# Patient Record
Sex: Male | Born: 1945
Health system: Southern US, Community
[De-identification: ages and names within clinical notes are randomized; demographics above are authoritative.]

## PROBLEM LIST (undated history)

## (undated) DIAGNOSIS — F319 Bipolar disorder, unspecified: Secondary | ICD-10-CM

## (undated) DIAGNOSIS — I10 Essential (primary) hypertension: Secondary | ICD-10-CM

---

## 2007-03-18 ENCOUNTER — Emergency Department (HOSPITAL_COMMUNITY): Admission: EM | Admit: 2007-03-18 | Discharge: 2007-03-18 | Payer: Self-pay | Admitting: Emergency Medicine

## 2011-06-07 ENCOUNTER — Other Ambulatory Visit: Payer: Self-pay | Admitting: Otolaryngology

## 2011-06-12 ENCOUNTER — Ambulatory Visit
Admission: RE | Admit: 2011-06-12 | Discharge: 2011-06-12 | Disposition: A | Discharge status: Home / Self Care | Payer: 59 | Source: Ambulatory Visit | Attending: Otolaryngology | Admitting: Otolaryngology

## 2011-06-12 MED ORDER — GADOBENATE DIMEGLUMINE 529 MG/ML IV SOLN
20.0000 mL | Freq: Once | INTRAVENOUS | Status: AC | PRN
  Administered 2011-06-12: 20 mL via INTRAVENOUS

## 2013-11-11 ENCOUNTER — Ambulatory Visit: Payer: 59 | Admitting: Internal Medicine

## 2015-02-17 DIAGNOSIS — F200 Paranoid schizophrenia: Secondary | ICD-10-CM | POA: Diagnosis not present

## 2015-02-18 ENCOUNTER — Ambulatory Visit (INDEPENDENT_AMBULATORY_CARE_PROVIDER_SITE_OTHER): Payer: Commercial Managed Care - HMO | Admitting: Emergency Medicine

## 2015-02-18 VITALS — BP 146/94 | HR 66 | Temp 97.7°F | Resp 17 | Ht 71.0 in | Wt 210.0 lb

## 2015-02-18 DIAGNOSIS — F319 Bipolar disorder, unspecified: Secondary | ICD-10-CM | POA: Insufficient documentation

## 2015-02-18 DIAGNOSIS — I10 Essential (primary) hypertension: Secondary | ICD-10-CM | POA: Diagnosis not present

## 2015-02-18 MED ORDER — HYDROCHLOROTHIAZIDE 25 MG PO TABS: 25.0000 mg | ORAL_TABLET | Freq: Every day | ORAL | Status: DC

## 2015-02-18 NOTE — Patient Instructions (Signed)
Come back in a month FASTING for lab tests and blood pressure check  Hypertension Hypertension, commonly called high blood pressure, is when the force of blood pumping through your arteries is too strong. Your arteries are the blood vessels that carry blood from your heart throughout your body. A blood pressure reading consists of a higher number over a lower number, such as 110/72. The higher number (systolic) is the pressure inside your arteries when your heart pumps. The lower number (diastolic) is the pressure inside your arteries when your heart relaxes. Ideally you want your blood pressure below 120/80. Hypertension forces your heart to work harder to pump blood. Your arteries may become narrow or stiff. Having hypertension puts you at risk for heart disease, stroke, and other problems.  RISK FACTORS Some risk factors for high blood pressure are controllable. Others are not.  Risk factors you cannot control include:   Race. You may be at higher risk if you are African American.  Age. Risk increases with age.  Gender. Men are at higher risk than women before age 68 years. After age 68, women are at higher risk than men. Risk factors you can control include:  Not getting enough exercise or physical activity.  Being overweight.  Getting too much fat, sugar, calories, or salt in your diet.  Drinking too much alcohol. SIGNS AND SYMPTOMS Hypertension does not usually cause signs or symptoms. Extremely high blood pressure (hypertensive crisis) may cause headache, anxiety, shortness of breath, and nosebleed. DIAGNOSIS  To check if you have hypertension, your health care provider will measure your blood pressure while you are seated, with your arm held at the level of your heart. It should be measured at least twice using the same arm. Certain conditions can cause a difference in blood pressure between your right and left arms. A blood pressure reading that is higher than normal on one  occasion does not mean that you need treatment. If one blood pressure reading is high, ask your health care provider about having it checked again. TREATMENT  Treating high blood pressure includes making lifestyle changes and possibly taking medicine. Living a healthy lifestyle can help lower high blood pressure. You may need to change some of your habits. Lifestyle changes may include:  Following the DASH diet. This diet is high in fruits, vegetables, and whole grains. It is low in salt, red meat, and added sugars.  Getting at least 2 hours of brisk physical activity every week.  Losing weight if necessary.  Not smoking.  Limiting alcoholic beverages.  Learning ways to reduce stress. If lifestyle changes are not enough to get your blood pressure under control, your health care provider may prescribe medicine. You may need to take more than one. Work closely with your health care provider to understand the risks and benefits. HOME CARE INSTRUCTIONS  Have your blood pressure rechecked as directed by your health care provider.   Take medicines only as directed by your health care provider. Follow the directions carefully. Blood pressure medicines must be taken as prescribed. The medicine does not work as well when you skip doses. Skipping doses also puts you at risk for problems.   Do not smoke.   Monitor your blood pressure at home as directed by your health care provider. SEEK MEDICAL CARE IF:   You think you are having a reaction to medicines taken.  You have recurrent headaches or feel dizzy.  You have swelling in your ankles.  You have trouble with your  vision. SEEK IMMEDIATE MEDICAL CARE IF:  You develop a severe headache or confusion.  You have unusual weakness, numbness, or feel faint.  You have severe chest or abdominal pain.  You vomit repeatedly.  You have trouble breathing. MAKE SURE YOU:   Understand these instructions.  Will watch your  condition.  Will get help right away if you are not doing well or get worse. Document Released: 11/06/2005 Document Revised: 03/23/2014 Document Reviewed: 08/29/2013 [facility] Patient Information 2015 [place], [place]. This information is not intended to replace advice given to you by your health care provider. Make sure you discuss any questions you have with your health care provider.

## 2015-02-18 NOTE — Progress Notes (Signed)
[facility] [address], [place] [id number] [phone]  Date:  02/18/2015   Name:  Todd MeliaRogers L Neal   DOB:  12/02/1945   MRN:  [id number]  PCP:  No primary care provider on file.    Chief Complaint: Hypertension   History of Present Illness:  Todd Neal is a 69 y.o. very pleasant male patient who presents with the following:  At psychiatrist's office yesterday and told he had an elevated blood pressure Thinks it was "190 something". No end organ injury No chest pain, shortness of breath.  No edema. No improvement with over the counter medications or other home remedies.  No history of CAD, HBP, HLD, DM Denies other complaint or health concern today.   Patient Active Problem List   Diagnosis Date Noted  . Bipolar disorder 02/18/2015    History reviewed. No pertinent past medical history.  History reviewed. No pertinent past surgical history.  History  Substance Use Topics  . Smoking status: Current Every Day Smoker -- 0.50 packs/day for 55 years    Types: Cigarettes  . Smokeless tobacco: Not on file  . Alcohol Use: No    History reviewed. No pertinent family history.  No Known Allergies  Medication list has been reviewed and updated.  No current outpatient prescriptions on file prior to visit.   No current facility-administered medications on file prior to visit.    Review of Systems:  As per HPI, otherwise negative.    Physical Examination: Filed Vitals:   02/18/15 1038  BP: 146/94  Pulse: 66  Temp: 97.7 F (36.5 C)  Resp: 17   Filed Vitals:   02/18/15 1038  Height: 5\' 11"  (1.803 m)  Weight: 210 lb (95.255 kg)   Body mass index is 29.3 kg/(m^2). Ideal Body Weight: Weight in (lb) to have BMI = 25: 178.9  GEN: WDWN, NAD, Non-toxic, A & O x 3 HEENT: Atraumatic, Normocephalic. Neck supple. No masses, No LAD. Ears and Nose: No external deformity. CV: RRR, No M/G/R. No JVD. No thrill. No extra heart sounds. PULM:  CTA B, no wheezes, crackles, rhonchi. No retractions. No resp. distress. No accessory muscle use. ABD: S, NT, ND, +BS. No rebound. No HSM. EXTR: No c/c/e NEURO Normal gait.  PSYCH: Normally interactive. Conversant. Not depressed or anxious appearing.  Calm demeanor.    Assessment and Plan: Hypertension HCTZ Follow up one month fasting for labs  Signed,  Phillips OdorJeffery Sadonna Kotara, MD

## 2015-02-22 DIAGNOSIS — Z79899 Other long term (current) drug therapy: Secondary | ICD-10-CM | POA: Diagnosis not present

## 2015-02-22 DIAGNOSIS — F209 Schizophrenia, unspecified: Secondary | ICD-10-CM | POA: Diagnosis not present

## 2015-03-15 DIAGNOSIS — F200 Paranoid schizophrenia: Secondary | ICD-10-CM | POA: Diagnosis not present

## 2015-03-29 DIAGNOSIS — Z79899 Other long term (current) drug therapy: Secondary | ICD-10-CM | POA: Diagnosis not present

## 2015-03-29 DIAGNOSIS — F200 Paranoid schizophrenia: Secondary | ICD-10-CM | POA: Diagnosis not present

## 2015-04-27 DIAGNOSIS — F200 Paranoid schizophrenia: Secondary | ICD-10-CM | POA: Diagnosis not present

## 2015-04-28 DIAGNOSIS — Z79899 Other long term (current) drug therapy: Secondary | ICD-10-CM | POA: Diagnosis not present

## 2015-04-28 DIAGNOSIS — F200 Paranoid schizophrenia: Secondary | ICD-10-CM | POA: Diagnosis not present

## 2015-08-10 DIAGNOSIS — F200 Paranoid schizophrenia: Secondary | ICD-10-CM | POA: Diagnosis not present

## 2017-08-01 ENCOUNTER — Encounter: Payer: Self-pay | Admitting: Emergency Medicine

## 2017-08-01 ENCOUNTER — Ambulatory Visit (INDEPENDENT_AMBULATORY_CARE_PROVIDER_SITE_OTHER): Payer: Medicare HMO | Admitting: Emergency Medicine

## 2017-08-01 VITALS — BP 170/90 | HR 53 | Temp 97.4°F | Resp 16 | Ht 70.5 in | Wt 218.6 lb

## 2017-08-01 DIAGNOSIS — Z1211 Encounter for screening for malignant neoplasm of colon: Secondary | ICD-10-CM | POA: Diagnosis not present

## 2017-08-01 DIAGNOSIS — I10 Essential (primary) hypertension: Secondary | ICD-10-CM | POA: Diagnosis not present

## 2017-08-01 DIAGNOSIS — Z6830 Body mass index (BMI) 30.0-30.9, adult: Secondary | ICD-10-CM

## 2017-08-01 DIAGNOSIS — E669 Obesity, unspecified: Secondary | ICD-10-CM

## 2017-08-01 MED ORDER — HYDROCHLOROTHIAZIDE 25 MG PO TABS: 25.0000 mg | ORAL_TABLET | Freq: Every day | ORAL | 3 refills | Status: DC

## 2017-08-01 NOTE — Patient Instructions (Addendum)
   IF you received an x-ray today, you will receive an invoice from [facility]. Please contact [facility] at [phone] with questions or concerns regarding your invoice.   IF you received labwork today, you will receive an invoice from [facility]. Please contact [facility] at [phone] with questions or concerns regarding your invoice.   Our billing staff will not be able to assist you with questions regarding bills from these companies.  You will be contacted with the lab results as soon as they are available. The fastest way to get your results is to activate your My Chart account. Instructions are located on the last page of this paperwork. If you have not heard from us regarding the results in 2 weeks, please contact this office.     How to Take Your Blood Pressure You can take your blood pressure at home with a machine. You may need to check your blood pressure at home:  To check if you have high blood pressure (hypertension).  To check your blood pressure over time.  To make sure your blood pressure medicine is working.  Supplies needed: You will need a blood pressure machine, or monitor. You can buy one at a drugstore or online. When choosing one:  Choose one with an arm cuff.  Choose one that wraps around your upper arm. Only one finger should fit between your arm and the cuff.  Do not choose one that measures your blood pressure from your wrist or finger.  Your doctor can suggest a monitor. How to prepare Avoid these things for 30 minutes before checking your blood pressure:  Drinking caffeine.  Drinking alcohol.  Eating.  Smoking.  Exercising.  Five minutes before checking your blood pressure:  Pee.  Sit in a dining chair. Avoid sitting in a soft couch or armchair.  Be quiet. Do not talk.  How to take your blood pressure Follow the instructions that came with your machine. If you have a digital blood pressure monitor, these may  be the instructions: 1. Sit up straight. 2. Place your feet on the floor. Do not cross your ankles or legs. 3. Rest your left arm at the level of your heart. You may rest it on a table, desk, or chair. 4. Pull up your shirt sleeve. 5. Wrap the blood pressure cuff around the upper part of your left arm. The cuff should be 1 inch (2.5 cm) above your elbow. It is best to wrap the cuff around bare skin. 6. Fit the cuff snugly around your arm. You should be able to place only one finger between the cuff and your arm. 7. Put the cord inside the groove of your elbow. 8. Press the power button. 9. Sit quietly while the cuff fills with air and loses air. 10. Write down the numbers on the screen. 11. Wait 2-3 minutes and then repeat steps 1-10.  What do the numbers mean? Two numbers make up your blood pressure. The first number is called systolic pressure. The second is called diastolic pressure. An example of a blood pressure reading is "120 over 80" (or 120/80). If you are an adult and do not have a medical condition, use this guide to find out if your blood pressure is normal: Normal  First number: below 120.  Second number: below 80. Elevated  First number: 120-129.  Second number: below 80. Hypertension stage 1  First number: 130-139.  Second number: 80-89. Hypertension stage 2  First number: 140 or above.    Second number: 90 or above. Your blood pressure is above normal even if only the top or bottom number is above normal. Follow these instructions at home:  Check your blood pressure as often as your doctor tells you to.  Take your monitor to your next doctor's appointment. Your doctor will: ? Make sure you are using it correctly. ? Make sure it is working right.  Make sure you understand what your blood pressure numbers should be.  Tell your doctor if your medicines are causing side effects. Contact a doctor if:  Your blood pressure keeps being high. Get help right  away if:  Your first blood pressure number is higher than 180.  Your second blood pressure number is higher than 120. This information is not intended to replace advice given to you by your health care provider. Make sure you discuss any questions you have with your health care provider. Document Released: 10/19/2008 Document Revised: 10/04/2016 Document Reviewed: 04/14/2016 Elsevier Interactive Patient Education  2018 [employer]  Hypertension Hypertension is another name for high blood pressure. High blood pressure forces your heart to work harder to pump blood. This can cause problems over time. There are two numbers in a blood pressure reading. There is a top number (systolic) over a bottom number (diastolic). It is best to have a blood pressure below 120/80. Healthy choices can help lower your blood pressure. You may need medicine to help lower your blood pressure if:  Your blood pressure cannot be lowered with healthy choices.  Your blood pressure is higher than 130/80.  Follow these instructions at home: Eating and drinking  If directed, follow the DASH eating plan. This diet includes: ? Filling half of your plate at each meal with fruits and vegetables. ? Filling one quarter of your plate at each meal with whole grains. Whole grains include whole wheat pasta, brown rice, and whole grain bread. ? Eating or drinking low-fat dairy products, such as skim milk or low-fat yogurt. ? Filling one quarter of your plate at each meal with low-fat (lean) proteins. Low-fat proteins include fish, skinless chicken, eggs, beans, and tofu. ? Avoiding fatty meat, cured and processed meat, or chicken with skin. ? Avoiding premade or processed food.  Eat less than 1,500 mg of salt (sodium) a day.  Limit alcohol use to no more than 1 drink a day for nonpregnant women and 2 drinks a day for men. One drink equals 12 oz of beer, 5 oz of wine, or 1 oz of hard liquor. Lifestyle  Work with your  doctor to stay at a healthy weight or to lose weight. Ask your doctor what the best weight is for you.  Get at least 30 minutes of exercise that causes your heart to beat faster (aerobic exercise) most days of the week. This may include walking, swimming, or biking.  Get at least 30 minutes of exercise that strengthens your muscles (resistance exercise) at least 3 days a week. This may include lifting weights or pilates.  Do not use any products that contain nicotine or tobacco. This includes cigarettes and e-cigarettes. If you need help quitting, ask your doctor.  Check your blood pressure at home as told by your doctor.  Keep all follow-up visits as told by your doctor. This is important. Medicines  Take over-the-counter and prescription medicines only as told by your doctor. Follow directions carefully.  Do not skip doses of blood pressure medicine. The medicine does not work as well if you skip doses.   Skipping doses also puts you at risk for problems.  Ask your doctor about side effects or reactions to medicines that you should watch for. Contact a doctor if:  You think you are having a reaction to the medicine you are taking.  You have headaches that keep coming back (recurring).  You feel dizzy.  You have swelling in your ankles.  You have trouble with your vision. Get help right away if:  You get a very bad headache.  You start to feel confused.  You feel weak or numb.  You feel faint.  You get very bad pain in your: ? Chest. ? Belly (abdomen).  You throw up (vomit) more than once.  You have trouble breathing. Summary  Hypertension is another name for high blood pressure.  Making healthy choices can help lower blood pressure. If your blood pressure cannot be controlled with healthy choices, you may need to take medicine. This information is not intended to replace advice given to you by your health care provider. Make sure you discuss any questions you have  with your health care provider. Document Released: 04/24/2008 Document Revised: 10/04/2016 Document Reviewed: 10/04/2016 Elsevier Interactive Patient Education  2018 [employer].  

## 2017-08-01 NOTE — Progress Notes (Signed)
Todd Neal 71 y.o.   Chief Complaint  Patient presents with  . Medication Refill    per patient and restart     HISTORY OF PRESENT ILLNESS: This is a 71 y.o. male with h/o HTN but off medication; no complaints; needs refill of HCTZ.  Hypertension  This is a chronic problem. The current episode started more than 1 year ago. The problem has been waxing and waning since onset. The problem is uncontrolled. Pertinent negatives include no blurred vision, chest pain, headaches, malaise/fatigue, neck pain, palpitations, peripheral edema, PND, shortness of breath or sweats. There are no associated agents to hypertension. Risk factors for coronary artery disease include male gender and sedentary lifestyle. Past treatments include diuretics. There is no history of angina, CAD/MI, CVA or heart failure.     Prior to Admission medications   Medication Sig Start Date End Date Taking? Authorizing Provider  hydrochlorothiazide (HYDRODIURIL) 25 MG tablet Take 1 tablet (25 mg total) by mouth daily. Patient not taking: Reported on 08/01/2017 02/18/15   Carmelina Dane, MD  lithium carbonate 300 MG capsule Take 300 mg by mouth 3 (three) times daily with meals.    [provider]    No Known Allergies  Patient Active Problem List   Diagnosis Date Noted  . Bipolar disorder (HCC) 02/18/2015    No past medical history on file.  No past surgical history on file.  Social History   Social History  . Marital status: Married    Spouse name: N/A  . Number of children: N/A  . Years of education: N/A   Occupational History  . Not on file.   Social History Main Topics  . Smoking status: Current Every Day Smoker    Packs/day: 0.50    Years: 55.00    Types: Cigarettes  . Smokeless tobacco: Never Used  . Alcohol use No  . Drug use: No  . Sexual activity: No   Other Topics Concern  . Not on file   Social History Narrative  . No narrative on file    No family history on  file.   Review of Systems  Constitutional: Negative for chills, fever and malaise/fatigue.  HENT: Negative.  Negative for sore throat.   Eyes: Negative.  Negative for blurred vision and double vision.  Respiratory: Negative.  Negative for cough and shortness of breath.   Cardiovascular: Negative.  Negative for chest pain, palpitations, claudication, leg swelling and PND.  Gastrointestinal: Negative.  Negative for abdominal pain, diarrhea, nausea and vomiting.  Genitourinary: Negative.   Musculoskeletal: Negative.  Negative for back pain, myalgias and neck pain.  Skin: Negative.  Negative for rash.  Neurological: Negative for dizziness, sensory change, speech change, focal weakness and headaches.  All other systems reviewed and are negative.  Vitals:   08/01/17 1346 08/01/17 1352  BP: (!) 176/75 (!) 170/90  Pulse: (!) 53   Resp: 16   Temp: (!) 97.4 F (36.3 C)   SpO2: 98%      Physical Exam  Constitutional: He is oriented to person, place, and time. He appears well-developed and well-nourished.  HENT:  Head: Normocephalic and atraumatic.  Right Ear: External ear normal.  Left Ear: External ear normal.  Nose: Nose normal.  Mouth/Throat: Oropharynx is clear and moist.  Eyes: Pupils are equal, round, and reactive to light. Conjunctivae are normal.  Neck: Normal range of motion. Neck supple. No JVD present. Carotid bruit is not present. No thyromegaly present.  Cardiovascular: Normal rate, regular  rhythm, normal heart sounds and intact distal pulses.   Pulmonary/Chest: Effort normal and breath sounds normal.  Abdominal: He exhibits no distension. There is no tenderness.  Musculoskeletal: Normal range of motion.  Lymphadenopathy:    He has no cervical adenopathy.  Neurological: He is alert and oriented to person, place, and time. No sensory deficit. He exhibits normal muscle tone.  Skin: Skin is warm and dry. Capillary refill takes less than 2 seconds. No rash noted.   Psychiatric: He has a normal mood and affect. His behavior is normal.  Vitals reviewed.    ASSESSMENT & PLAN: Todd Neal was seen today for medication refill.  Diagnoses and all orders for this visit:  Essential hypertension -     CBC with Differential/Platelet -     Comprehensive metabolic panel -     Hemoglobin A1c -     Hepatitis C antibody -     hydrochlorothiazide (HYDRODIURIL) 25 MG tablet; Take 1 tablet (25 mg total) by mouth daily.  Colon cancer screening -     Ambulatory referral to Gastroenterology    Patient Instructions       IF you received an x-ray today, you will receive an invoice from [facility]. Please contact [facility] at [phone] with questions or concerns regarding your invoice.   IF you received labwork today, you will receive an invoice from [facility]. Please contact [facility] at [phone] with questions or concerns regarding your invoice.   Our billing staff will not be able to assist you with questions regarding bills from these companies.  You will be contacted with the lab results as soon as they are available. The fastest way to get your results is to activate your My Chart account. Instructions are located on the last page of this paperwork. If you have not heard from us regarding the results in 2 weeks, please contact this office.     How to Take Your Blood Pressure You can take your blood pressure at home with a machine. You may need to check your blood pressure at home:  To check if you have high blood pressure (hypertension).  To check your blood pressure over time.  To make sure your blood pressure medicine is working.  Supplies needed: You will need a blood pressure machine, or monitor. You can buy one at a drugstore or online. When choosing one:  Choose one with an arm cuff.  Choose one that wraps around your upper arm. Only one finger should fit between your arm and the cuff.  Do not choose one that  measures your blood pressure from your wrist or finger.  Your doctor can suggest a monitor. How to prepare Avoid these things for 30 minutes before checking your blood pressure:  Drinking caffeine.  Drinking alcohol.  Eating.  Smoking.  Exercising.  Five minutes before checking your blood pressure:  Pee.  Sit in a dining chair. Avoid sitting in a soft couch or armchair.  Be quiet. Do not talk.  How to take your blood pressure Follow the instructions that came with your machine. If you have a digital blood pressure monitor, these may be the instructions: 1. Sit up straight. 2. Place your feet on the floor. Do not cross your ankles or legs. 3. Rest your left arm at the level of your heart. You may rest it on a table, desk, or chair. 4. Pull up your shirt sleeve. 5. Wrap the blood pressure cuff around the upper part of your left arm. The  cuff should be 1 inch (2.5 cm) above your elbow. It is best to wrap the cuff around bare skin. 6. Fit the cuff snugly around your arm. You should be able to place only one finger between the cuff and your arm. 7. Put the cord inside the groove of your elbow. 8. Press the power button. 9. Sit quietly while the cuff fills with air and loses air. 10. Write down the numbers on the screen. 11. Wait 2-3 minutes and then repeat steps 1-10.  What do the numbers mean? Two numbers make up your blood pressure. The first number is called systolic pressure. The second is called diastolic pressure. An example of a blood pressure reading is "120 over 80" (or 120/80). If you are an adult and do not have a medical condition, use this guide to find out if your blood pressure is normal: Normal  First number: below 120.  Second number: below 80. Elevated  First number: 120-129.  Second number: below 80. Hypertension stage 1  First number: 130-139.  Second number: 80-89. Hypertension stage 2  First number: 140 or above.  Second number: 90 or  above. Your blood pressure is above normal even if only the top or bottom number is above normal. Follow these instructions at home:  Check your blood pressure as often as your doctor tells you to.  Take your monitor to your next doctor's appointment. Your doctor will: ? Make sure you are using it correctly. ? Make sure it is working right.  Make sure you understand what your blood pressure numbers should be.  Tell your doctor if your medicines are causing side effects. Contact a doctor if:  Your blood pressure keeps being high. Get help right away if:  Your first blood pressure number is higher than 180.  Your second blood pressure number is higher than 120. This information is not intended to replace advice given to you by your health care provider. Make sure you discuss any questions you have with your health care provider. Document Released: 10/19/2008 Document Revised: 10/04/2016 Document Reviewed: 04/14/2016 Elsevier Interactive Patient Education  2018 [employer].  Hypertension Hypertension is another name for high blood pressure. High blood pressure forces your heart to work harder to pump blood. This can cause problems over time. There are two numbers in a blood pressure reading. There is a top number (systolic) over a bottom number (diastolic). It is best to have a blood pressure below 120/80. Healthy choices can help lower your blood pressure. You may need medicine to help lower your blood pressure if:  Your blood pressure cannot be lowered with healthy choices.  Your blood pressure is higher than 130/80.  Follow these instructions at home: Eating and drinking  If directed, follow the DASH eating plan. This diet includes: ? Filling half of your plate at each meal with fruits and vegetables. ? Filling one quarter of your plate at each meal with whole grains. Whole grains include whole wheat pasta, brown rice, and whole grain bread. ? Eating or drinking low-fat dairy  products, such as skim milk or low-fat yogurt. ? Filling one quarter of your plate at each meal with low-fat (lean) proteins. Low-fat proteins include fish, skinless chicken, eggs, beans, and tofu. ? Avoiding fatty meat, cured and processed meat, or chicken with skin. ? Avoiding premade or processed food.  Eat less than 1,500 mg of salt (sodium) a day.  Limit alcohol use to no more than 1 drink a day for nonpregnant  women and 2 drinks a day for men. One drink equals 12 oz of beer, 5 oz of wine, or 1 oz of hard liquor. Lifestyle  Work with your doctor to stay at a healthy weight or to lose weight. Ask your doctor what the best weight is for you.  Get at least 30 minutes of exercise that causes your heart to beat faster (aerobic exercise) most days of the week. This may include walking, swimming, or biking.  Get at least 30 minutes of exercise that strengthens your muscles (resistance exercise) at least 3 days a week. This may include lifting weights or pilates.  Do not use any products that contain nicotine or tobacco. This includes cigarettes and e-cigarettes. If you need help quitting, ask your doctor.  Check your blood pressure at home as told by your doctor.  Keep all follow-up visits as told by your doctor. This is important. Medicines  Take over-the-counter and prescription medicines only as told by your doctor. Follow directions carefully.  Do not skip doses of blood pressure medicine. The medicine does not work as well if you skip doses. Skipping doses also puts you at risk for problems.  Ask your doctor about side effects or reactions to medicines that you should watch for. Contact a doctor if:  You think you are having a reaction to the medicine you are taking.  You have headaches that keep coming back (recurring).  You feel dizzy.  You have swelling in your ankles.  You have trouble with your vision. Get help right away if:  You get a very bad headache.  You  start to feel confused.  You feel weak or numb.  You feel faint.  You get very bad pain in your: ? Chest. ? Belly (abdomen).  You throw up (vomit) more than once.  You have trouble breathing. Summary  Hypertension is another name for high blood pressure.  Making healthy choices can help lower blood pressure. If your blood pressure cannot be controlled with healthy choices, you may need to take medicine. This information is not intended to replace advice given to you by your health care provider. Make sure you discuss any questions you have with your health care provider. Document Released: 04/24/2008 Document Revised: 10/04/2016 Document Reviewed: 10/04/2016 Elsevier Interactive Patient Education  2018 Elsevier Inc.      Edwina Barth, MD [facility]

## 2017-08-02 LAB — COMPREHENSIVE METABOLIC PANEL
A/G RATIO: 1.8 (ref 1.2–2.2)
ALT: 16 IU/L (ref 0–44)
AST: 18 IU/L (ref 0–40)
Albumin: 4.5 g/dL (ref 3.6–4.8)
Alkaline Phosphatase: 82 IU/L (ref 39–117)
BILIRUBIN TOTAL: 0.2 mg/dL (ref 0.0–1.2)
BUN/Creatinine Ratio: 14 (ref 10–24)
BUN: 13 mg/dL (ref 8–27)
CALCIUM: 9.8 mg/dL (ref 8.6–10.2)
CHLORIDE: 105 mmol/L (ref 96–106)
CO2: 23 mmol/L (ref 20–29)
Creatinine, Ser: 0.96 mg/dL (ref 0.76–1.27)
GFR calc Af Amer: 93 mL/min/{1.73_m2} (ref >59)
GFR calc non Af Amer: 80 mL/min/{1.73_m2} (ref >59)
GLUCOSE: 102 mg/dL — AB (ref 65–99)
Globulin, Total: 2.5 g/dL (ref 1.5–4.5)
POTASSIUM: 4.3 mmol/L (ref 3.5–5.2)
Sodium: 143 mmol/L (ref 134–144)
Total Protein: 7 g/dL (ref 6.0–8.5)

## 2017-08-02 LAB — CBC WITH DIFFERENTIAL/PLATELET
BASOS: 0 %
Basophils Absolute: 0 10*3/uL (ref 0.0–0.2)
EOS (ABSOLUTE): 0.3 10*3/uL (ref 0.0–0.4)
Eos: 3 %
Hematocrit: 43.8 % (ref 37.5–51.0)
Hemoglobin: 13.9 g/dL (ref 13.0–17.7)
IMMATURE GRANULOCYTES: 0 %
Immature Grans (Abs): 0 10*3/uL (ref 0.0–0.1)
LYMPHS: 46 %
Lymphocytes Absolute: 4.2 10*3/uL — ABNORMAL HIGH (ref 0.7–3.1)
MCH: 30 pg (ref 26.6–33.0)
MCHC: 31.7 g/dL (ref 31.5–35.7)
MCV: 94 fL (ref 79–97)
Monocytes Absolute: 0.6 10*3/uL (ref 0.1–0.9)
Monocytes: 7 %
NEUTROS PCT: 44 %
Neutrophils Absolute: 4 10*3/uL (ref 1.4–7.0)
PLATELETS: 309 10*3/uL (ref 150–379)
RBC: 4.64 x10E6/uL (ref 4.14–5.80)
RDW: 14.3 % (ref 12.3–15.4)
WBC: 9.1 10*3/uL (ref 3.4–10.8)

## 2017-08-02 LAB — HEMOGLOBIN A1C
ESTIMATED AVERAGE GLUCOSE: 117 mg/dL
Hgb A1c MFr Bld: 5.7 % — ABNORMAL HIGH (ref 4.8–5.6)

## 2017-08-02 LAB — HEPATITIS C ANTIBODY: Hep C Virus Ab: <0.1 s/co ratio (ref 0.0–0.9)

## 2017-08-06 ENCOUNTER — Encounter: Payer: Self-pay | Admitting: *Deleted

## 2017-09-10 ENCOUNTER — Encounter: Payer: Self-pay | Admitting: Emergency Medicine

## 2017-11-02 ENCOUNTER — Ambulatory Visit: Payer: Medicare HMO | Admitting: Emergency Medicine

## 2018-02-05 DIAGNOSIS — M4696 Unspecified inflammatory spondylopathy, lumbar region: Secondary | ICD-10-CM | POA: Diagnosis not present

## 2018-02-05 DIAGNOSIS — M545 Low back pain: Secondary | ICD-10-CM | POA: Diagnosis not present

## 2018-03-04 DIAGNOSIS — I10 Essential (primary) hypertension: Secondary | ICD-10-CM | POA: Diagnosis not present

## 2018-03-04 DIAGNOSIS — E669 Obesity, unspecified: Secondary | ICD-10-CM | POA: Diagnosis not present

## 2018-03-04 DIAGNOSIS — Z131 Encounter for screening for diabetes mellitus: Secondary | ICD-10-CM | POA: Diagnosis not present

## 2018-03-04 DIAGNOSIS — R69 Illness, unspecified: Secondary | ICD-10-CM | POA: Diagnosis not present

## 2018-03-04 DIAGNOSIS — Z5181 Encounter for therapeutic drug level monitoring: Secondary | ICD-10-CM | POA: Diagnosis not present

## 2018-03-04 DIAGNOSIS — Z01118 Encounter for examination of ears and hearing with other abnormal findings: Secondary | ICD-10-CM | POA: Diagnosis not present

## 2018-03-04 DIAGNOSIS — Z136 Encounter for screening for cardiovascular disorders: Secondary | ICD-10-CM | POA: Diagnosis not present

## 2018-03-04 DIAGNOSIS — H538 Other visual disturbances: Secondary | ICD-10-CM | POA: Diagnosis not present

## 2018-03-04 DIAGNOSIS — Z720 Tobacco use: Secondary | ICD-10-CM | POA: Diagnosis not present

## 2018-03-04 DIAGNOSIS — Z0000 Encounter for general adult medical examination without abnormal findings: Secondary | ICD-10-CM | POA: Diagnosis not present

## 2018-03-19 DIAGNOSIS — Z720 Tobacco use: Secondary | ICD-10-CM | POA: Diagnosis not present

## 2018-03-19 DIAGNOSIS — E781 Pure hyperglyceridemia: Secondary | ICD-10-CM | POA: Diagnosis not present

## 2018-03-19 DIAGNOSIS — I119 Hypertensive heart disease without heart failure: Secondary | ICD-10-CM | POA: Diagnosis not present

## 2018-03-19 DIAGNOSIS — R7303 Prediabetes: Secondary | ICD-10-CM | POA: Diagnosis not present

## 2018-03-19 DIAGNOSIS — E669 Obesity, unspecified: Secondary | ICD-10-CM | POA: Diagnosis not present

## 2018-03-19 DIAGNOSIS — R972 Elevated prostate specific antigen [PSA]: Secondary | ICD-10-CM | POA: Diagnosis not present

## 2018-03-19 DIAGNOSIS — I10 Essential (primary) hypertension: Secondary | ICD-10-CM | POA: Diagnosis not present

## 2019-12-15 ENCOUNTER — Emergency Department (HOSPITAL_COMMUNITY)
Admission: EM | Admit: 2019-12-15 | Discharge: 2019-12-16 | Disposition: A | Discharge status: Home / Self Care | Payer: Medicare HMO | Attending: Emergency Medicine | Admitting: Emergency Medicine

## 2019-12-15 ENCOUNTER — Encounter (HOSPITAL_COMMUNITY): Payer: Self-pay | Admitting: Emergency Medicine

## 2019-12-15 ENCOUNTER — Other Ambulatory Visit: Payer: Self-pay

## 2019-12-15 ENCOUNTER — Emergency Department (HOSPITAL_COMMUNITY): Payer: Medicare HMO

## 2019-12-15 DIAGNOSIS — R220 Localized swelling, mass and lump, head: Secondary | ICD-10-CM | POA: Diagnosis not present

## 2019-12-15 DIAGNOSIS — Z23 Encounter for immunization: Secondary | ICD-10-CM | POA: Insufficient documentation

## 2019-12-15 DIAGNOSIS — Z79899 Other long term (current) drug therapy: Secondary | ICD-10-CM | POA: Insufficient documentation

## 2019-12-15 DIAGNOSIS — Y999 Unspecified external cause status: Secondary | ICD-10-CM | POA: Diagnosis not present

## 2019-12-15 DIAGNOSIS — Y939 Activity, unspecified: Secondary | ICD-10-CM | POA: Diagnosis not present

## 2019-12-15 DIAGNOSIS — S0990XA Unspecified injury of head, initial encounter: Secondary | ICD-10-CM | POA: Diagnosis not present

## 2019-12-15 DIAGNOSIS — S0240EA Zygomatic fracture, right side, initial encounter for closed fracture: Secondary | ICD-10-CM | POA: Insufficient documentation

## 2019-12-15 DIAGNOSIS — R69 Illness, unspecified: Secondary | ICD-10-CM | POA: Diagnosis not present

## 2019-12-15 DIAGNOSIS — R0902 Hypoxemia: Secondary | ICD-10-CM | POA: Diagnosis not present

## 2019-12-15 DIAGNOSIS — G4489 Other headache syndrome: Secondary | ICD-10-CM | POA: Diagnosis not present

## 2019-12-15 DIAGNOSIS — R52 Pain, unspecified: Secondary | ICD-10-CM | POA: Diagnosis not present

## 2019-12-15 DIAGNOSIS — F17210 Nicotine dependence, cigarettes, uncomplicated: Secondary | ICD-10-CM | POA: Diagnosis not present

## 2019-12-15 DIAGNOSIS — Y929 Unspecified place or not applicable: Secondary | ICD-10-CM | POA: Diagnosis not present

## 2019-12-15 DIAGNOSIS — I10 Essential (primary) hypertension: Secondary | ICD-10-CM | POA: Insufficient documentation

## 2019-12-15 DIAGNOSIS — S0993XA Unspecified injury of face, initial encounter: Secondary | ICD-10-CM | POA: Diagnosis present

## 2019-12-15 HISTORY — DX: Essential (primary) hypertension: I10

## 2019-12-15 MED ORDER — TETANUS-DIPHTH-ACELL PERTUSSIS 5-2.5-18.5 LF-MCG/0.5 IM SUSP
0.5000 mL | Freq: Once | INTRAMUSCULAR | Status: AC
  Administered 2019-12-15: 0.5 mL via INTRAMUSCULAR
  Filled 2019-12-15: qty 0.5
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
## 2019-12-16 DIAGNOSIS — S0240EA Zygomatic fracture, right side, initial encounter for closed fracture: Secondary | ICD-10-CM | POA: Diagnosis not present

## 2019-12-16 DIAGNOSIS — R220 Localized swelling, mass and lump, head: Secondary | ICD-10-CM | POA: Diagnosis not present

## 2019-12-16 DIAGNOSIS — S0990XA Unspecified injury of head, initial encounter: Secondary | ICD-10-CM | POA: Diagnosis not present

## 2019-12-16 LAB — RAPID URINE DRUG SCREEN, HOSP PERFORMED
Amphetamines: NOT DETECTED
Barbiturates: NOT DETECTED
Benzodiazepines: NOT DETECTED
Cocaine: NOT DETECTED
Opiates: NOT DETECTED
Tetrahydrocannabinol: NOT DETECTED

## 2019-12-16 LAB — ETHANOL: Alcohol, Ethyl (B): <10 mg/dL (ref <10)

## 2019-12-16 MED ORDER — CEPHALEXIN 500 MG PO CAPS: 500.0000 mg | ORAL_CAPSULE | Freq: Four times a day (QID) | ORAL | 0 refills | Status: DC

## 2019-12-16 MED ORDER — HYDROCODONE-ACETAMINOPHEN 5-325 MG PO TABS: 1.0000 | ORAL_TABLET | Freq: Four times a day (QID) | ORAL | 0 refills | Status: DC | PRN

## 2019-12-16 NOTE — ED Notes (Addendum)
Pt pulled IV out 

## 2019-12-16 NOTE — Discharge Instructions (Addendum)
You were found to have a fracture of her right zygoma.  Follow-up with plastic surgery as indicated.  Take medications as prescribed.  You will be given an antibiotic given the abrasion over your cheek.  If you notice increasing swelling or redness you should be reevaluated.

## 2019-12-16 NOTE — ED Notes (Addendum)
Pt walking was steady

## 2020-02-26 ENCOUNTER — Emergency Department (HOSPITAL_COMMUNITY): Payer: Medicare HMO

## 2020-02-26 ENCOUNTER — Emergency Department (HOSPITAL_COMMUNITY)
Admission: EM | Admit: 2020-02-26 | Discharge: 2020-02-26 | Disposition: A | Discharge status: Home / Self Care | Payer: Medicare HMO | Attending: Emergency Medicine | Admitting: Emergency Medicine

## 2020-02-26 ENCOUNTER — Encounter (HOSPITAL_COMMUNITY): Payer: Self-pay

## 2020-02-26 ENCOUNTER — Other Ambulatory Visit: Payer: Self-pay

## 2020-02-26 DIAGNOSIS — Y939 Activity, unspecified: Secondary | ICD-10-CM | POA: Diagnosis not present

## 2020-02-26 DIAGNOSIS — X58XXXA Exposure to other specified factors, initial encounter: Secondary | ICD-10-CM | POA: Diagnosis not present

## 2020-02-26 DIAGNOSIS — Y929 Unspecified place or not applicable: Secondary | ICD-10-CM | POA: Diagnosis not present

## 2020-02-26 DIAGNOSIS — I10 Essential (primary) hypertension: Secondary | ICD-10-CM | POA: Insufficient documentation

## 2020-02-26 DIAGNOSIS — R600 Localized edema: Secondary | ICD-10-CM | POA: Diagnosis not present

## 2020-02-26 DIAGNOSIS — Y999 Unspecified external cause status: Secondary | ICD-10-CM | POA: Diagnosis not present

## 2020-02-26 DIAGNOSIS — S82831A Other fracture of upper and lower end of right fibula, initial encounter for closed fracture: Secondary | ICD-10-CM | POA: Diagnosis not present

## 2020-02-26 DIAGNOSIS — F17210 Nicotine dependence, cigarettes, uncomplicated: Secondary | ICD-10-CM | POA: Insufficient documentation

## 2020-02-26 DIAGNOSIS — Z79899 Other long term (current) drug therapy: Secondary | ICD-10-CM | POA: Insufficient documentation

## 2020-02-26 DIAGNOSIS — R69 Illness, unspecified: Secondary | ICD-10-CM | POA: Diagnosis not present

## 2020-02-26 DIAGNOSIS — S8991XA Unspecified injury of right lower leg, initial encounter: Secondary | ICD-10-CM | POA: Diagnosis present

## 2020-02-26 DIAGNOSIS — M7989 Other specified soft tissue disorders: Secondary | ICD-10-CM | POA: Diagnosis not present

## 2020-02-26 NOTE — ED Notes (Signed)
Pt returned from xray
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
## 2020-02-26 NOTE — Discharge Instructions (Signed)
Follow-up with the orthopedist provided.  Return here as needed °

## 2020-02-26 NOTE — ED Notes (Signed)
Pt ambulatory without difficulty to tx room
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
## 2020-02-27 DIAGNOSIS — I10 Essential (primary) hypertension: Secondary | ICD-10-CM | POA: Diagnosis not present

## 2020-02-27 DIAGNOSIS — Z0001 Encounter for general adult medical examination with abnormal findings: Secondary | ICD-10-CM | POA: Diagnosis not present

## 2020-02-27 DIAGNOSIS — R7303 Prediabetes: Secondary | ICD-10-CM | POA: Diagnosis not present

## 2020-02-27 DIAGNOSIS — I119 Hypertensive heart disease without heart failure: Secondary | ICD-10-CM | POA: Diagnosis not present

## 2020-02-27 DIAGNOSIS — R972 Elevated prostate specific antigen [PSA]: Secondary | ICD-10-CM | POA: Diagnosis not present

## 2020-02-27 DIAGNOSIS — Z720 Tobacco use: Secondary | ICD-10-CM | POA: Diagnosis not present

## 2020-02-27 DIAGNOSIS — Z130 Encounter for screening for diseases of the blood and blood-forming organs and certain disorders involving the immune mechanism: Secondary | ICD-10-CM | POA: Diagnosis not present

## 2020-02-27 DIAGNOSIS — E781 Pure hyperglyceridemia: Secondary | ICD-10-CM | POA: Diagnosis not present

## 2020-02-27 NOTE — ED Notes (Signed)
Pt up to nursing station, states he is leaving. Refusing to stay to talk to ED PA. States he has things to do and is in a hurry. Pt ambulated out to the lobby without difficulty. ED PA made aware of pt departure.

## 2021-04-21 ENCOUNTER — Emergency Department (HOSPITAL_COMMUNITY): Admission: EM | Admit: 2021-04-21 | Discharge: 2021-04-21 | Discharge status: Absent without leave | Payer: Medicare HMO

## 2021-04-21 ENCOUNTER — Other Ambulatory Visit: Payer: Self-pay

## 2021-04-21 DIAGNOSIS — R69 Illness, unspecified: Secondary | ICD-10-CM | POA: Diagnosis not present

## 2021-04-21 DIAGNOSIS — R404 Transient alteration of awareness: Secondary | ICD-10-CM | POA: Diagnosis not present

## 2021-04-21 DIAGNOSIS — R609 Edema, unspecified: Secondary | ICD-10-CM | POA: Diagnosis not present

## 2021-05-03 ENCOUNTER — Emergency Department (HOSPITAL_COMMUNITY)
Admission: EM | Admit: 2021-05-03 | Discharge: 2021-05-03 | Disposition: A | Discharge status: Home / Self Care | Payer: Medicare HMO | Attending: Emergency Medicine | Admitting: Emergency Medicine

## 2021-05-03 ENCOUNTER — Other Ambulatory Visit: Payer: Self-pay

## 2021-05-03 ENCOUNTER — Emergency Department (HOSPITAL_COMMUNITY): Payer: Medicare HMO

## 2021-05-03 DIAGNOSIS — R52 Pain, unspecified: Secondary | ICD-10-CM

## 2021-05-03 DIAGNOSIS — Z79899 Other long term (current) drug therapy: Secondary | ICD-10-CM | POA: Insufficient documentation

## 2021-05-03 DIAGNOSIS — X58XXXA Exposure to other specified factors, initial encounter: Secondary | ICD-10-CM | POA: Insufficient documentation

## 2021-05-03 DIAGNOSIS — I10 Essential (primary) hypertension: Secondary | ICD-10-CM | POA: Diagnosis not present

## 2021-05-03 DIAGNOSIS — S52222A Displaced transverse fracture of shaft of left ulna, initial encounter for closed fracture: Secondary | ICD-10-CM | POA: Diagnosis not present

## 2021-05-03 DIAGNOSIS — S5292XA Unspecified fracture of left forearm, initial encounter for closed fracture: Secondary | ICD-10-CM | POA: Diagnosis not present

## 2021-05-03 DIAGNOSIS — S52252A Displaced comminuted fracture of shaft of ulna, left arm, initial encounter for closed fracture: Secondary | ICD-10-CM | POA: Diagnosis not present

## 2021-05-03 DIAGNOSIS — R69 Illness, unspecified: Secondary | ICD-10-CM | POA: Diagnosis not present

## 2021-05-03 DIAGNOSIS — S59912A Unspecified injury of left forearm, initial encounter: Secondary | ICD-10-CM | POA: Diagnosis present

## 2021-05-03 DIAGNOSIS — F17210 Nicotine dependence, cigarettes, uncomplicated: Secondary | ICD-10-CM | POA: Insufficient documentation

## 2021-05-03 DIAGNOSIS — M79642 Pain in left hand: Secondary | ICD-10-CM | POA: Diagnosis not present

## 2021-05-03 DIAGNOSIS — M7989 Other specified soft tissue disorders: Secondary | ICD-10-CM | POA: Diagnosis not present

## 2021-05-03 MED ORDER — LORAZEPAM 1 MG PO TABS
1.0000 mg | ORAL_TABLET | Freq: Once | ORAL | Status: AC
  Administered 2021-05-03: 1 mg via ORAL
  Filled 2021-05-03: qty 1

## 2021-05-03 MED ORDER — HYDROCODONE-ACETAMINOPHEN 5-325 MG PO TABS: 1.0000 | ORAL_TABLET | Freq: Four times a day (QID) | ORAL | 0 refills | Status: DC | PRN

## 2021-05-03 NOTE — ED Triage Notes (Signed)
Pt went to jail and needs medical clearance for jail. L hand swelling that has been present for over two weeks

## 2021-05-03 NOTE — ED Notes (Signed)
Pt agitated and is refusing vitals

## 2021-05-03 NOTE — Discharge Instructions (Signed)
You have broken your left forearm.  Please follow-up with orthopedist in 1 week for further care.  Wear splint for protection
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
## 2021-05-03 NOTE — ED Provider Notes (Signed)
   Received signout at the beginning of shift, please see previous providers notes for complete H&P.  Patient brought here by [facility] [facility] the [facility] house due to having a significant swelling to his left arm.  He does have psychiatric condition which makes it difficult to obtain history and to communicate.  Multiple x-rays of the arms were performed showing evidence of a comminuted and mildly displaced midshaft fracture of the left ulna.  It appears to be subacute.  His forearm is edematous but the compartments soft without evidence of compartment syndrome.  Radial pulse intact.  I have ordered a splint, will give Ortho referral.  10:02 AM There was a prolonged wait to have Ortho tech to apply a splint appropriately.  Post splint reassessment done.  Patient stable for discharge back to the jail house.  SPLINT APPLICATION Date/Time: 10:03 AM Authorized by: Fayrene Helper Consent: Verbal consent obtained. Risks and benefits: risks, benefits and alternatives were discussed Consent given by: patient Splint applied by: orthopedic technician Location details: L forearm Splint type: long arm Supplies used: plaster Post-procedure: The splinted body part was neurovascularly unchanged following the procedure. Patient tolerance: Patient tolerated the procedure well with no immediate complications.       Fayrene Helper, PA-C 05/03/21 1004    Mancel Bale, MD 05/05/21 [phone]

## 2021-05-03 NOTE — Progress Notes (Signed)
Orthopedic Tech Progress Note Patient Details:  Todd Neal 08/27/1946 [id number]  Ortho Devices Type of Ortho Device: Ace wrap, Arm sling, Sugartong splint Ortho Device/Splint Location: left Ortho Device/Splint Interventions: Application   Post Interventions Patient Tolerated: Well Instructions Provided: Care of device  [name] 05/03/2021, 9:58 AM

## 2021-05-05 ENCOUNTER — Encounter (HOSPITAL_COMMUNITY): Payer: Self-pay | Admitting: Emergency Medicine

## 2021-05-05 ENCOUNTER — Emergency Department (HOSPITAL_COMMUNITY)
Admission: EM | Admit: 2021-05-05 | Discharge: 2021-05-05 | Disposition: A | Discharge status: Home / Self Care | Payer: Medicare HMO | Attending: Emergency Medicine | Admitting: Emergency Medicine

## 2021-05-05 DIAGNOSIS — M79642 Pain in left hand: Secondary | ICD-10-CM | POA: Diagnosis not present

## 2021-05-05 DIAGNOSIS — R69 Illness, unspecified: Secondary | ICD-10-CM | POA: Diagnosis not present

## 2021-05-05 DIAGNOSIS — Z79899 Other long term (current) drug therapy: Secondary | ICD-10-CM | POA: Insufficient documentation

## 2021-05-05 DIAGNOSIS — M79609 Pain in unspecified limb: Secondary | ICD-10-CM

## 2021-05-05 DIAGNOSIS — F17210 Nicotine dependence, cigarettes, uncomplicated: Secondary | ICD-10-CM | POA: Insufficient documentation

## 2021-05-05 DIAGNOSIS — R609 Edema, unspecified: Secondary | ICD-10-CM | POA: Diagnosis not present

## 2021-05-05 DIAGNOSIS — I10 Essential (primary) hypertension: Secondary | ICD-10-CM | POA: Insufficient documentation
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
## 2021-05-05 NOTE — Discharge Instructions (Addendum)
Followup with your doctor as needed
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
## 2021-05-05 NOTE — ED Notes (Signed)
Patient is uncooperative. Patient refused vital signs and states he wants to go home. Patient refuses to answer questions.

## 2021-05-16 ENCOUNTER — Emergency Department (HOSPITAL_COMMUNITY)
Admission: EM | Admit: 2021-05-16 | Discharge: 2021-05-16 | Disposition: A | Discharge status: Home / Self Care | Payer: Medicare HMO | Attending: Emergency Medicine | Admitting: Emergency Medicine

## 2021-05-16 ENCOUNTER — Other Ambulatory Visit: Payer: Self-pay

## 2021-05-16 ENCOUNTER — Emergency Department (HOSPITAL_COMMUNITY): Payer: Medicare HMO

## 2021-05-16 DIAGNOSIS — X58XXXA Exposure to other specified factors, initial encounter: Secondary | ICD-10-CM | POA: Diagnosis not present

## 2021-05-16 DIAGNOSIS — Z79899 Other long term (current) drug therapy: Secondary | ICD-10-CM | POA: Diagnosis not present

## 2021-05-16 DIAGNOSIS — S52222A Displaced transverse fracture of shaft of left ulna, initial encounter for closed fracture: Secondary | ICD-10-CM | POA: Diagnosis not present

## 2021-05-16 DIAGNOSIS — I10 Essential (primary) hypertension: Secondary | ICD-10-CM | POA: Diagnosis not present

## 2021-05-16 DIAGNOSIS — M7989 Other specified soft tissue disorders: Secondary | ICD-10-CM | POA: Diagnosis not present

## 2021-05-16 DIAGNOSIS — S52252S Displaced comminuted fracture of shaft of ulna, left arm, sequela: Secondary | ICD-10-CM | POA: Insufficient documentation

## 2021-05-16 DIAGNOSIS — S59912A Unspecified injury of left forearm, initial encounter: Secondary | ICD-10-CM | POA: Diagnosis not present

## 2021-05-16 DIAGNOSIS — Z743 Need for continuous supervision: Secondary | ICD-10-CM | POA: Diagnosis not present

## 2021-05-16 DIAGNOSIS — S52222S Displaced transverse fracture of shaft of left ulna, sequela: Secondary | ICD-10-CM

## 2021-05-16 DIAGNOSIS — F17210 Nicotine dependence, cigarettes, uncomplicated: Secondary | ICD-10-CM | POA: Diagnosis not present

## 2021-05-16 DIAGNOSIS — R69 Illness, unspecified: Secondary | ICD-10-CM | POA: Diagnosis not present

## 2021-05-16 DIAGNOSIS — M11261 Other chondrocalcinosis, right knee: Secondary | ICD-10-CM | POA: Diagnosis not present

## 2021-05-16 DIAGNOSIS — L0390 Cellulitis, unspecified: Secondary | ICD-10-CM | POA: Diagnosis not present

## 2021-05-16 DIAGNOSIS — M79604 Pain in right leg: Secondary | ICD-10-CM | POA: Diagnosis not present

## 2021-05-16 DIAGNOSIS — Y92481 Parking lot as the place of occurrence of the external cause: Secondary | ICD-10-CM | POA: Diagnosis not present

## 2021-05-16 DIAGNOSIS — S52252A Displaced comminuted fracture of shaft of ulna, left arm, initial encounter for closed fracture: Secondary | ICD-10-CM | POA: Diagnosis not present

## 2021-05-16 LAB — COMPREHENSIVE METABOLIC PANEL
ALT: 52 U/L — ABNORMAL HIGH (ref 0–44)
AST: 17 U/L (ref 15–41)
Albumin: 3.9 g/dL (ref 3.5–5.0)
Alkaline Phosphatase: 90 U/L (ref 38–126)
Anion gap: 6 (ref 5–15)
BUN: 17 mg/dL (ref 8–23)
CO2: 30 mmol/L (ref 22–32)
Calcium: 9.4 mg/dL (ref 8.9–10.3)
Chloride: 108 mmol/L (ref 98–111)
Creatinine, Ser: 0.98 mg/dL (ref 0.61–1.24)
GFR, Estimated: >60 mL/min (ref >60)
Glucose, Bld: 80 mg/dL (ref 70–99)
Potassium: 4 mmol/L (ref 3.5–5.1)
Sodium: 144 mmol/L (ref 135–145)
Total Bilirubin: 0.6 mg/dL (ref 0.3–1.2)
Total Protein: 6.6 g/dL (ref 6.5–8.1)

## 2021-05-16 LAB — CBC WITH DIFFERENTIAL/PLATELET
Abs Immature Granulocytes: 0.02 10*3/uL (ref 0.00–0.07)
Basophils Absolute: 0 10*3/uL (ref 0.0–0.1)
Basophils Relative: 0 %
Eosinophils Absolute: 0.2 10*3/uL (ref 0.0–0.5)
Eosinophils Relative: 3 %
HCT: 36.8 % — ABNORMAL LOW (ref 39.0–52.0)
Hemoglobin: 11.3 g/dL — ABNORMAL LOW (ref 13.0–17.0)
Immature Granulocytes: 0 %
Lymphocytes Relative: 27 %
Lymphs Abs: 1.9 10*3/uL (ref 0.7–4.0)
MCH: 30.5 pg (ref 26.0–34.0)
MCHC: 30.7 g/dL (ref 30.0–36.0)
MCV: 99.2 fL (ref 80.0–100.0)
Monocytes Absolute: 0.6 10*3/uL (ref 0.1–1.0)
Monocytes Relative: 9 %
Neutro Abs: 4.2 10*3/uL (ref 1.7–7.7)
Neutrophils Relative %: 61 %
Platelets: 327 10*3/uL (ref 150–400)
RBC: 3.71 MIL/uL — ABNORMAL LOW (ref 4.22–5.81)
RDW: 14 % (ref 11.5–15.5)
WBC: 6.9 10*3/uL (ref 4.0–10.5)
nRBC: 0 % (ref 0.0–0.2)
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
## 2021-05-16 NOTE — ED Provider Notes (Signed)
[facility]-EMERGENCY DEPT Provider Note   CSN: [id number] Arrival date & time: 05/16/21  1246     History Chief Complaint  Patient presents with   Leg Pain    Todd Neal is a 75 y.o. male.  Todd Neal was brought in by EMS after being found sitting on the ground.  He was recently seen here for some musculoskeletal pain, and based on chart review, he has had an ulnar fracture of the left forearm.  Today, he tells me that he wants food.  He was initially hesitant to let me examine his knee, and this was only other source of pain.  He denies any trauma to the leg.  The history is provided by the patient.  Leg Pain Location:  Knee Injury: no   Knee location:  R knee Pain details:    Quality:  Throbbing   Radiates to:  Does not radiate   Severity:  Moderate   Onset quality:  Gradual   Duration: weeks.   Timing:  Constant   Progression:  Unchanged Chronicity:  New Dislocation: no   Prior injury to area:  No Relieved by:  Nothing Worsened by:  Bearing weight Ineffective treatments:  None tried Associated symptoms: swelling   Associated symptoms: no back pain, no decreased ROM, no fever, no numbness and no tingling       Past Medical History:  Diagnosis Date   Hypertension     Patient Active Problem List   Diagnosis Date Noted   Essential hypertension 08/01/2017   Colon cancer screening 08/01/2017   Bipolar disorder (HCC) 02/18/2015    No past surgical history on file.     No family history on file.  Social History   Tobacco Use   Smoking status: Every Day    Packs/day: 0.50    Years: 55.00    Pack years: 27.50    Types: Cigarettes   Smokeless tobacco: Never  Substance Use Topics   Alcohol use: No    Alcohol/week: 0.0 standard drinks   Drug use: No    Home Medications Prior to Admission medications   Medication Sig Start Date End Date Taking? Authorizing Provider  cephALEXin (KEFLEX) 500 MG capsule Take 1 capsule  (500 mg total) by mouth 4 (four) times daily. 12/16/19   Horton, Mayer Masker, MD  hydrochlorothiazide (HYDRODIURIL) 25 MG tablet Take 1 tablet (25 mg total) by mouth daily. 08/01/17   Georgina Quint, MD  HYDROcodone-acetaminophen (NORCO/VICODIN) 5-325 MG tablet Take 1 tablet by mouth every 6 (six) hours as needed for moderate pain. 05/03/21   Fayrene Helper, PA-C    Allergies    Patient has no known allergies.  Review of Systems   Review of Systems  Constitutional:  Negative for chills and fever.  HENT:  Negative for ear pain and sore throat.   Eyes:  Negative for pain and visual disturbance.  Respiratory:  Negative for cough and shortness of breath.   Cardiovascular:  Negative for chest pain and palpitations.  Gastrointestinal:  Negative for abdominal pain and vomiting.  Genitourinary:  Negative for dysuria and hematuria.  Musculoskeletal:  Positive for arthralgias and joint swelling. Negative for back pain.  Skin:  Negative for color change and rash.  Neurological:  Negative for seizures and syncope.  All other systems reviewed and are negative.  Physical Exam Updated Vital Signs BP 102/85 (BP Location: Right Arm)   Pulse 80   Temp 98.9 F (37.2 C) (Oral)   Resp  18   Ht 5\' 11"  (1.803 m)   Wt 99.2 kg   SpO2 99%   BMI 30.50 kg/m   Physical Exam Vitals and nursing note reviewed.  Constitutional:      Appearance: Normal appearance.  HENT:     Head: Normocephalic and atraumatic.  Eyes:     Conjunctiva/sclera: Conjunctivae normal.  Pulmonary:     Effort: Pulmonary effort is normal. No respiratory distress.  Musculoskeletal:        General: No deformity. Normal range of motion.     Cervical back: Normal range of motion.     Comments: Right knee is normal to inspection without deformity.  There is a 1+ knee effusion and mild, diffuse tenderness to palpation about the knee.  Range of motion is from 0 to 60 degrees with some mild pain.  Distal pulses are full.  The left hand  and forearm are both swollen.  He appears to have difficulty with active extension of his fingers.  Range of motion is limited at the wrist.  This extremity is warm and well-perfused.  Skin:    General: Skin is warm and dry.  Neurological:     General: No focal deficit present.     Mental Status: He is alert and oriented to person, place, and time. Mental status is at baseline.  Psychiatric:        Mood and Affect: Mood normal.    ED Results / Procedures / Treatments   Labs (all labs ordered are listed, but only abnormal results are displayed) Labs Reviewed  COMPREHENSIVE METABOLIC PANEL  CBC WITH DIFFERENTIAL/PLATELET    EKG None  Radiology DG Forearm Left  Result Date: 05/16/2021 CLINICAL DATA:  Left forearm pain after injury. EXAM: LEFT FOREARM - 2 VIEW COMPARISON:  None. FINDINGS: Moderately displaced and comminuted fracture is seen involving the midshaft of the left ulna. The radius is unremarkable. Some callus formation is noted suggesting subacute fracture. IMPRESSION: Moderately displaced and comminuted left ulnar midshaft fracture is noted with some callus formation present, suggesting subacute fracture. Electronically Signed   By: 05/18/2021 M.D.   On: 05/16/2021 14:49   DG Knee Complete 4 Views Right  Result Date: 05/16/2021 CLINICAL DATA:  Pain and swelling EXAM: RIGHT KNEE - COMPLETE 4+ VIEW COMPARISON:  Right tibia and fibula radiographs February 26, 2020 FINDINGS: Frontal, lateral, and bilateral oblique views were obtained. No acute fracture or dislocation evident. Old healed fracture proximal fibula noted. No evident joint effusion. There is mild joint space narrowing medially. There is chondrocalcinosis. There is a small benign exostosis arising from the medial distal femoral metaphysis. IMPRESSION: No acute fracture or dislocation. Old healed fracture proximal fibula with remodeling. No joint effusion. Mild joint space narrowing medially. There is chondrocalcinosis  which may be seen with osteoarthritis or calcium pyrophosphate deposition disease. Electronically Signed   By: February 28, 2020 III M.D.   On: 05/16/2021 14:49    Procedures Procedures   Medications Ordered in ED Medications - No data to display  ED Course  I have reviewed the triage vital signs and the nursing notes.  Pertinent labs & imaging results that were available during my care of the patient were reviewed by me and considered in my medical decision making (see chart for details).    MDM Rules/Calculators/A&P                          05/18/2021 presents upon referral  from [facility] after they called EMS.  The patient was found sitting down.  He arrived in the ED and was asking mainly for food.  He endorsed 2 locations of musculoskeletal pain, his right knee and left forearm.  These have been sources of pain in the past, and he has documented imaging revealing an ulnar shaft fracture back in April of this year.  He has not followed up with orthopedics, and his struggle with homelessness has likely contributed to his inability to follow-up.  I have also ordered labs which are pending. If they are normal, he can be discharged home.  Labs are normal aside from mild anemia. He is suitable for discharge home. Final Clinical Impression(s) / ED Diagnoses Final diagnoses:  Closed displaced transverse fracture of shaft of left ulna, sequela  Chondrocalcinosis of right knee    Rx / DC Orders ED Discharge Orders     None        Koleen Distance, MD 05/16/21 1535

## 2021-05-19 ENCOUNTER — Emergency Department (HOSPITAL_COMMUNITY)
Admission: EM | Admit: 2021-05-19 | Discharge: 2021-05-19 | Discharge status: Against Medical Advice | Payer: Medicare HMO | Attending: Emergency Medicine | Admitting: Emergency Medicine

## 2021-05-19 DIAGNOSIS — M79642 Pain in left hand: Secondary | ICD-10-CM | POA: Insufficient documentation

## 2021-05-19 DIAGNOSIS — Z5321 Procedure and treatment not carried out due to patient leaving prior to being seen by health care provider: Secondary | ICD-10-CM | POA: Insufficient documentation

## 2021-05-19 DIAGNOSIS — R69 Illness, unspecified: Secondary | ICD-10-CM | POA: Diagnosis not present

## 2021-05-19 DIAGNOSIS — R451 Restlessness and agitation: Secondary | ICD-10-CM | POA: Diagnosis not present

## 2021-05-19 DIAGNOSIS — M7989 Other specified soft tissue disorders: Secondary | ICD-10-CM | POA: Diagnosis not present

## 2021-05-19 NOTE — ED Provider Notes (Addendum)
Emergency Medicine Provider Triage Evaluation Note  Todd Neal , a 75 y.o. male  was evaluated in triage.  Patient has past medical history of bipolar disorder.  He has been seen multiple times in the ER for similar symptoms.  Known moderately displaced and comminuted left ulnar midshaft fracture with callus formation suggesting subacute.  He has been repeatedly referred to orthopedics.    His son and POA at bedside.  Son Todd Neal's phone number is [phone].  Patient struggles with psychiatric disorder versus dementia.  He is not receiving adequate care in outpatient setting.  Son/POA cites that there are also housing difficulties and that they have not had success with placement.    Patient Level 5 caveat  Review of Systems  Positive: Swelling Negative: New changes, pain, fevers  Physical Exam  BP (!) 201/122 (BP Location: Right Arm)   Pulse 93   Temp 98.6 F (37 C) (Oral)   Resp 16   SpO2 96%  Gen:   Awake, no distress   Resp:  Normal effort  MSK:   Left arm swelling.  He can execute good grip strength.  Pedal pulse intact.  Sensation intact throughout. Other:    Medical Decision Making  Medically screening exam initiated at 11:42 AM.  Appropriate orders placed.  [name] was informed that the remainder of the evaluation will be completed by another provider, this initial triage assessment does not replace that evaluation, and the importance of remaining in the ED until their evaluation is complete.  Transitions of care order placed   Lorelee New, PA-C 05/19/21 1142    Lorelee New, PA-C 05/19/21 1143    Linwood Dibbles, MD 05/20/21 [phone]

## 2021-05-19 NOTE — ED Notes (Signed)
Pt very agitated and decided to leave.
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
## 2021-05-20 ENCOUNTER — Other Ambulatory Visit: Payer: Self-pay

## 2021-05-20 ENCOUNTER — Ambulatory Visit (HOSPITAL_COMMUNITY)
Admission: EM | Admit: 2021-05-20 | Discharge: 2021-05-20 | Disposition: A | Discharge status: Home / Self Care | Payer: Medicare HMO | Attending: Licensed Clinical Social Worker | Admitting: Licensed Clinical Social Worker

## 2021-05-20 DIAGNOSIS — F3170 Bipolar disorder, currently in remission, most recent episode unspecified: Secondary | ICD-10-CM

## 2021-05-20 DIAGNOSIS — R69 Illness, unspecified: Secondary | ICD-10-CM | POA: Diagnosis not present

## 2021-05-20 NOTE — BH Assessment (Signed)
TTS triage: Patient presents with son, Todd Neal, who is POA. Patient presents with rambling, incoherent speech. He is a poor historian. He denies SI/HI/AVH. However, son states he has been on lithium in the past but is not taking due to side effects. He is currently homeless. He does not have any outpatient services.   Patient is urgent.

## 2021-05-20 NOTE — Discharge Instructions (Addendum)
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
## 2021-05-20 NOTE — Discharge Summary (Signed)
[name] to be D/C'd Home per MD order. Discussed with the patient and all questions fully answered. An After Visit Summary was printed and given to the patient.  Patient escorted out and D/C home via private auto.  Dickie La  05/20/2021 9:34 AM

## 2021-05-20 NOTE — ED Provider Notes (Signed)
Behavioral Health Urgent Care Medical Screening Exam  Patient Name: Todd Neal MRN: [id number] Date of Evaluation: 05/20/21 Chief Complaint:   Diagnosis:  Final diagnoses:  Bipolar disorder in partial remission, most recent episode unspecified type [facility])    History of Present illness: Todd Neal is a 75 y.o. male.  Patient presents voluntarily to [facility] behavioral health for walk-in assessment.  Patient request that his son, Todd Neal remain present during assessment.  Todd Neal reports he is Todd Neal's healthcare power of attorney.  Patient's son reports he would like patient seen today mainly to be evaluated related to a recent injury to his left arm.  Patient's aunt is also worried about swelling to patient's leg.  Todd Neal is assessed by nurse practitioner.  He is alert and oriented to self and partially alert to situation.  He is alert to date and time.  He is pleasant and cooperative during assessment.  Per patient's son he was diagnosed with both schizophrenia and dementia 7 years ago.  He is seated on chair during assessment.  His speech is slurred.  He is tangential at times discussing the fact that he would like to visit his brother in [place] who is a Optician, dispensing.  He reports he would like to have his brother return to [place] to lead a church in the [place] area.  Todd Neal denies suicidal and homicidal ideations.  He denies any history of suicide attempts, denies any history of self-harm.  He denies auditory and visual hallucinations.  He denies symptoms of paranoia.  He contracts verbally for safety with this Clinical research associate.  He denies any mental health history, reports his only current medication is a medication used to treat blood pressure.  He reports he has resided with his son for the past 7 days but would like to leave his son's home today because he does not like cats and his sons home has 2 cats.  Prior to residing in his son's home for the last 7 days he has been  homeless for approximately 8 months.  He reports he is retired and has income.  He denies access to weapons.  He endorses occasional wine use, denies substance use aside from wine.  He endorses average sleep and appetite.  Patient offered support and encouragement.  He gives verbal consent to speak with his son.  Patient's son reports patient has a history of crack cocaine and alcohol use disorder that began approximately 7 years ago.  He reports Todd Neal has not used any substance or alcohol for the last 7 days. Per Todd Neal, the patient's family including brothers and sisters, have distanced himself from the patient related to his substance use.  Patient's son reports plan to assist patient in following up with outpatient primary care as well as outpatient psychiatry.  No safety concerns reported.  Psychiatric Specialty Exam  Presentation  General Appearance:Disheveled  Eye Contact:Fair  Speech:Slurred  Speech Volume:Normal  Handedness:Right   Mood and Affect  Mood:Euthymic  Affect:Congruent   Thought Process  Thought Processes:Goal Directed  Descriptions of Associations:Intact  Orientation:Partial  Thought Content:Tangential    Hallucinations:None  Ideas of Reference:None  Suicidal Thoughts:No  Homicidal Thoughts:No   Sensorium  Memory:Immediate Poor; Recent Poor  Judgment:Impaired  Insight:Present   Executive Functions  Concentration:Poor  Attention Span:Fair  Recall:Poor  Fund of Knowledge:Fair  Language:Fair   Psychomotor Activity  Psychomotor Activity:Shuffling Gait   Assets  Assets:Communication Skills; Desire for Improvement; Financial Resources/Insurance; Housing; Social Support   Sleep  Sleep:Fair  Number of hours:  No data recorded  No data recorded  Physical Exam: Physical Exam Constitutional:      Appearance: Normal appearance. He is normal weight.  HENT:     Head: Normocephalic and atraumatic.     Nose: Nose normal.   Pulmonary:     Effort: Pulmonary effort is normal.  Musculoskeletal:        General: Normal range of motion.     Cervical back: Normal range of motion.  Neurological:     Mental Status: He is alert. Mental status is at baseline.  Psychiatric:        Attention and Perception: Attention and perception normal.        Mood and Affect: Mood and affect normal.        Speech: Speech is tangential.        Behavior: Behavior normal. Behavior is cooperative.        Thought Content: Thought content normal.        Cognition and Memory: Cognition is impaired.   Review of Systems  Constitutional: Negative.   HENT: Negative.    Eyes: Negative.   Respiratory: Negative.    Cardiovascular: Negative.   Gastrointestinal: Negative.   Genitourinary: Negative.   Musculoskeletal: Negative.   Skin: Negative.   Neurological: Negative.   Endo/Heme/Allergies: Negative.   Psychiatric/Behavioral: Negative.    Blood pressure (!) 183/80, pulse 90, temperature 98 F (36.7 C), temperature source Oral, resp. rate 18, SpO2 99 %. There is no height or weight on file to calculate BMI.  Musculoskeletal: Strength & Muscle Tone: decreased Gait & Station: unsteady Patient leans: N/A   [facility] MSE Discharge Disposition for Follow up and Recommendations: Based on my evaluation the patient does not appear to have an emergency medical condition and can be discharged with resources and follow up care in outpatient services for Medication Management and Individual Therapy Patient reviewed with Dr. Bronwen Betters. Follow-up with outpatient psychiatry, resources provided. Follow-up with outpatient primary care, resources provided.   Lenard Lance, FNP 05/20/2021, 6:07 PM

## 2021-05-31 ENCOUNTER — Inpatient Hospital Stay: Payer: Medicare HMO | Admitting: Physician Assistant

## 2021-06-01 ENCOUNTER — Encounter: Payer: Self-pay | Admitting: Physician Assistant

## 2021-06-01 ENCOUNTER — Encounter (HOSPITAL_COMMUNITY): Payer: Self-pay

## 2021-06-01 ENCOUNTER — Ambulatory Visit: Payer: Medicare HMO | Admitting: Physician Assistant

## 2021-06-01 ENCOUNTER — Other Ambulatory Visit: Payer: Self-pay

## 2021-06-01 ENCOUNTER — Ambulatory Visit (INDEPENDENT_AMBULATORY_CARE_PROVIDER_SITE_OTHER)
Admission: EM | Admit: 2021-06-01 | Discharge: 2021-06-01 | Disposition: A | Discharge status: Other Acute Inpatient Hospital | Payer: Medicare HMO | Source: Home / Self Care

## 2021-06-01 ENCOUNTER — Emergency Department (HOSPITAL_COMMUNITY)
Admission: EM | Admit: 2021-06-01 | Discharge: 2021-06-07 | Disposition: A | Discharge status: Home / Self Care | Payer: Medicare HMO | Attending: Emergency Medicine | Admitting: Emergency Medicine

## 2021-06-01 VITALS — HR 89 | Temp 98.4°F | Ht 71.0 in | Wt 156.0 lb

## 2021-06-01 DIAGNOSIS — S52202D Unspecified fracture of shaft of left ulna, subsequent encounter for closed fracture with routine healing: Secondary | ICD-10-CM | POA: Diagnosis not present

## 2021-06-01 DIAGNOSIS — F319 Bipolar disorder, unspecified: Secondary | ICD-10-CM | POA: Insufficient documentation

## 2021-06-01 DIAGNOSIS — F0390 Unspecified dementia without behavioral disturbance: Secondary | ICD-10-CM | POA: Insufficient documentation

## 2021-06-01 DIAGNOSIS — S59912D Unspecified injury of left forearm, subsequent encounter: Secondary | ICD-10-CM | POA: Diagnosis present

## 2021-06-01 DIAGNOSIS — F22 Delusional disorders: Secondary | ICD-10-CM | POA: Insufficient documentation

## 2021-06-01 DIAGNOSIS — I10 Essential (primary) hypertension: Secondary | ICD-10-CM | POA: Insufficient documentation

## 2021-06-01 DIAGNOSIS — R4689 Other symptoms and signs involving appearance and behavior: Secondary | ICD-10-CM | POA: Diagnosis not present

## 2021-06-01 DIAGNOSIS — F17210 Nicotine dependence, cigarettes, uncomplicated: Secondary | ICD-10-CM | POA: Insufficient documentation

## 2021-06-01 DIAGNOSIS — Z20822 Contact with and (suspected) exposure to covid-19: Secondary | ICD-10-CM | POA: Diagnosis not present

## 2021-06-01 DIAGNOSIS — S52222S Displaced transverse fracture of shaft of left ulna, sequela: Secondary | ICD-10-CM

## 2021-06-01 DIAGNOSIS — S52502D Unspecified fracture of the lower end of left radius, subsequent encounter for closed fracture with routine healing: Secondary | ICD-10-CM | POA: Diagnosis not present

## 2021-06-01 DIAGNOSIS — Z046 Encounter for general psychiatric examination, requested by authority: Secondary | ICD-10-CM

## 2021-06-01 DIAGNOSIS — Z79899 Other long term (current) drug therapy: Secondary | ICD-10-CM | POA: Insufficient documentation

## 2021-06-01 DIAGNOSIS — R69 Illness, unspecified: Secondary | ICD-10-CM | POA: Diagnosis not present

## 2021-06-01 LAB — COMPREHENSIVE METABOLIC PANEL WITH GFR
ALT: 15 U/L (ref 0–44)
AST: 18 U/L (ref 15–41)
Albumin: 4.2 g/dL (ref 3.5–5.0)
Alkaline Phosphatase: 113 U/L (ref 38–126)
Anion gap: 6 (ref 5–15)
BUN: 14 mg/dL (ref 8–23)
CO2: 30 mmol/L (ref 22–32)
Calcium: 9.8 mg/dL (ref 8.9–10.3)
Chloride: 104 mmol/L (ref 98–111)
Creatinine, Ser: 0.88 mg/dL (ref 0.61–1.24)
GFR, Estimated: >60 mL/min
Glucose, Bld: 105 mg/dL — ABNORMAL HIGH (ref 70–99)
Potassium: 3.9 mmol/L (ref 3.5–5.1)
Sodium: 140 mmol/L (ref 135–145)
Total Bilirubin: 0.7 mg/dL (ref 0.3–1.2)
Total Protein: 7.3 g/dL (ref 6.5–8.1)

## 2021-06-01 LAB — RAPID URINE DRUG SCREEN, HOSP PERFORMED
Amphetamines: NOT DETECTED
Barbiturates: NOT DETECTED
Benzodiazepines: NOT DETECTED
Cocaine: NOT DETECTED
Opiates: NOT DETECTED
Tetrahydrocannabinol: POSITIVE — AB

## 2021-06-01 LAB — CBC WITH DIFFERENTIAL/PLATELET
Abs Immature Granulocytes: 0.02 10*3/uL (ref 0.00–0.07)
Basophils Absolute: 0.1 10*3/uL (ref 0.0–0.1)
Basophils Relative: 1 %
Eosinophils Absolute: 0.3 10*3/uL (ref 0.0–0.5)
Eosinophils Relative: 4 %
HCT: 40.5 % (ref 39.0–52.0)
Hemoglobin: 12.3 g/dL — ABNORMAL LOW (ref 13.0–17.0)
Immature Granulocytes: 0 %
Lymphocytes Relative: 34 %
Lymphs Abs: 2.7 10*3/uL (ref 0.7–4.0)
MCH: 30.2 pg (ref 26.0–34.0)
MCHC: 30.4 g/dL (ref 30.0–36.0)
MCV: 99.5 fL (ref 80.0–100.0)
Monocytes Absolute: 0.6 10*3/uL (ref 0.1–1.0)
Monocytes Relative: 8 %
Neutro Abs: 4.3 10*3/uL (ref 1.7–7.7)
Neutrophils Relative %: 53 %
Platelets: 434 10*3/uL — ABNORMAL HIGH (ref 150–400)
RBC: 4.07 MIL/uL — ABNORMAL LOW (ref 4.22–5.81)
RDW: 14 % (ref 11.5–15.5)
WBC: 8 10*3/uL (ref 4.0–10.5)
nRBC: 0 % (ref 0.0–0.2)

## 2021-06-01 LAB — RESP PANEL BY RT-PCR (FLU A&B, COVID) ARPGX2
Influenza A by PCR: NEGATIVE
Influenza B by PCR: NEGATIVE
SARS Coronavirus 2 by RT PCR: NEGATIVE

## 2021-06-01 LAB — ETHANOL: Alcohol, Ethyl (B): <10 mg/dL

## 2021-06-01 MED ORDER — ZIPRASIDONE MESYLATE 20 MG IM SOLR
INTRAMUSCULAR | Status: AC
  Administered 2021-06-01: 20 mg
  Filled 2021-06-01: qty 20

## 2021-06-01 MED ORDER — STERILE WATER FOR INJECTION IJ SOLN
INTRAMUSCULAR | Status: AC
  Administered 2021-06-01: 2 mL
  Filled 2021-06-01: qty 10

## 2021-06-01 MED ORDER — ZIPRASIDONE MESYLATE 20 MG IM SOLR: 20.0000 mg | Freq: Once | INTRAMUSCULAR | Status: AC

## 2021-06-01 NOTE — ED Notes (Signed)
Report called to Verlon Au, Forensic scientist [facility].

## 2021-06-01 NOTE — ED Provider Notes (Signed)
Behavioral Health Admission [facility]BS)  Date: 06/01/21 Patient Name: Todd Neal MRN: [id number] Chief Complaint:  Chief Complaint  Patient presents with   Delusional    Pt presented on a voluntary basis to [facility] with a golf club in hand and complaining about family members.  He reported that he can read minds, that he is [employer].  Pt appeared to respond to internal stimuli.       Diagnoses:  Final diagnoses:  Bipolar I disorder (HCC)    HPI: Patient arrives voluntarily to [facility] behavioral health for walk-in assessment.  [name] appears confused, participates minimally in assessment.  He is seated in assessment area upon arrival, no apparent distress.  He becomes frustrated with this Clinical research associate states "you cut me off, I do not like that leave the room."  He is a poor historian at this time.  He presents with irritable affect, refuses to participate in assessment further when this writer attempted to ask assessment questions.  At this time he is alert to self.  He appears irritable during assessment.  He is noted to have slurred speech.  He denies suicidal and homicidal ideations.  It appears patient is likely responding to internal stimuli.  He is noted to speak in a conversation like manner for an unoccupied corner of room.  Patient presents with delusional behavior states "I am God, I know."  His speech is tangential in nature, discusses his late wife also reports recently "falling in love" with a cat belonging to his son.  In review of the medical record patient has a history of schizophrenia, appears to be diagnosed in 2016.  Bipolar disorder is also included in patient's history.  Also per family patient has been diagnosed with dementia.  [name] is unable to discuss current medications at this time.  Contacted magistrate's office who report family members have petition patient, police will be directed to [facility] behavioral health to start  petition.  PHQ 2-9:  Flowsheet Row ED from 06/01/2021 in [facility] ED from 05/20/2021 in [facility]  Thoughts that you would be better off dead, or of hurting yourself in some way Not at all Not at all  PHQ-9 Total Score 5 5       Flowsheet Row ED from 05/20/2021 in [facility] ED from 05/16/2021 in [facility] from 05[facility]05/2021 in [facility]  C-SSRS RISK CATEGORY No Risk No Risk No Risk        Total Time spent with patient: 20 minutes  Musculoskeletal  Strength & Muscle Tone: decreased Gait & Station: unsteady Patient leans: N/A  Psychiatric Specialty Exam  Presentation General Appearance: Disheveled  Eye Contact:Good  Speech:Slurred  Speech Volume:Normal  Handedness:Right   Mood and Affect  Mood:Irritable  Affect:Labile   Thought Process  Thought Processes:Disorganized  Descriptions of Associations:Tangential  Orientation:Partial (oriented to self only)  Thought Content:Tangential  Diagnosis of Schizophrenia or Schizoaffective disorder in past: Yes  Duration of Psychotic Symptoms: Greater than six months  Hallucinations:Hallucinations: -- (Patient appears to be responding to internal stimuli, noted to speak toward unoccupied corner of the room.) Ideas of Reference:None  Suicidal Thoughts:Suicidal Thoughts: No Homicidal Thoughts:Homicidal Thoughts: No  Sensorium  Memory:Immediate Poor; Recent Poor  Judgment:Impaired  Insight:Shallow   Executive Functions  Concentration:Poor  Attention Span:Poor  Recall:Poor  Fund of Knowledge:Fair  Language:Fair   Psychomotor Activity  Psychomotor Activity:Psychomotor Activity:  Shuffling Gait  Assets  Assets:Social Support   Sleep  Sleep:Sleep: -- (Unable to assess)  Nutritional Assessment (For OBS and FBC admissions only) Has the patient had a  weight loss or gain of 10 pounds or more in the last 3 months?: -- (Unable to assess) Has the patient had a decrease in food intake/or appetite?: -- (Unable to assess) Does the patient have dental problems?: -- (Unable to assess) Does the patient have eating habits or behaviors that may be indicators of an eating disorder including binging or inducing vomiting?: -- (Unable to assess) Has the patient recently lost weight without trying?: Patient is unsure   Physical Exam Vitals and nursing note reviewed.  Constitutional:      Appearance: Normal appearance. He is well-developed.  HENT:     Head: Normocephalic and atraumatic.     Nose: Nose normal.  Cardiovascular:     Rate and Rhythm: Normal rate.  Pulmonary:     Effort: Pulmonary effort is normal.  Musculoskeletal:        General: Normal range of motion.     Cervical back: Normal range of motion.  Neurological:     Mental Status: He is alert. He is disoriented.  Psychiatric:        Attention and Perception: He is inattentive.        Mood and Affect: Mood is anxious. Affect is labile.        Speech: Speech is slurred and tangential.        Behavior: Behavior is uncooperative.        Thought Content: Thought content is delusional.        Cognition and Memory: Memory is impaired.   Review of Systems  Constitutional: Negative.   HENT: Negative.    Eyes: Negative.   Respiratory: Negative.    Cardiovascular: Negative.   Gastrointestinal: Negative.   Genitourinary: Negative.   Musculoskeletal: Negative.   Skin: Negative.   Neurological: Negative.   Endo/Heme/Allergies: Negative.   Psychiatric/Behavioral:  Positive for memory loss.    Blood pressure (!) 163/83, pulse 92, temperature 98.3 F (36.8 C), temperature source Oral, resp. rate 18, SpO2 98 %. There is no height or weight on file to calculate BMI.  Past Psychiatric History: Schizophrenia, bipolar disorder, dementia  Is the patient at risk to self? No  Has the patient  been a risk to self in the past 6 months? No .    Has the patient been a risk to self within the distant past? No   Is the patient a risk to others? Yes   Has the patient been a risk to others in the past 6 months?  Unable to assess Has the patient been a risk to others within the distant past?  Unable to assess  Past Medical History:  Past Medical History:  Diagnosis Date   Hypertension    No past surgical history on file.  Family History: No family history on file.  Social History:  Social History   Socioeconomic History   Marital status: Married    Spouse name: Not on file   Number of children: Not on file   Years of education: Not on file   Highest education level: Not on file  Occupational History   Not on file  Tobacco Use   Smoking status: Every Day    Packs/day: 0.50    Years: 55.00    Pack years: 27.50    Types: Cigarettes   Smokeless tobacco: Never  Substance and  Sexual Activity   Alcohol use: No    Alcohol/week: 0.0 standard drinks   Drug use: No   Sexual activity: Never    Birth control/protection: Abstinence  Other Topics Concern   Not on file  Social History Narrative   Not on file   Social Determinants of Health   Financial Resource Strain: Not on file  Food Insecurity: Not on file  Transportation Needs: Not on file  Physical Activity: Not on file  Stress: Not on file  Social Connections: Not on file  Intimate Partner Violence: Not on file    SDOH:  SDOH Screenings   Alcohol Screen: Not on file  Depression (PHQ2-9): Medium Risk   PHQ-2 Score: 5  Financial Resource Strain: Not on file  Food Insecurity: Not on file  Housing: Not on file  Physical Activity: Not on file  Social Connections: Not on file  Stress: Not on file  Tobacco Use: High Risk   Smoking Tobacco Use: Every Day   Smokeless Tobacco Use: Never  Transportation Needs: Not on file    Last Labs:  Admission on 05/16/2021, Discharged on 05/16/2021  Component Date Value Ref  Range Status   Sodium 05/16/2021 144  135 - 145 mmol/L Final   Potassium 05/16/2021 4.0  3.5 - 5.1 mmol/L Final   Chloride 05/16/2021 108  98 - 111 mmol/L Final   CO2 05/16/2021 30  22 - 32 mmol/L Final   Glucose, Bld 05/16/2021 80  70 - 99 mg/dL Final   Glucose reference range applies only to samples taken after fasting for at least 8 hours.   BUN 05/16/2021 17  8 - 23 mg/dL Final   Creatinine, Ser 05/16/2021 0.98  0.61 - 1.24 mg/dL Final   Calcium 40/98/1191 9.4  8.9 - 10.3 mg/dL Final   Total Protein 47/82/9562 6.6  6.5 - 8.1 g/dL Final   Albumin 13/06/6577 3.9  3.5 - 5.0 g/dL Final   AST 46/96/2952 17  15 - 41 U/L Final   ALT 05/16/2021 52 (A) 0 - 44 U/L Final   Alkaline Phosphatase 05/16/2021 90  38 - 126 U/L Final   Total Bilirubin 05/16/2021 0.6  0.3 - 1.2 mg/dL Final   GFR, Estimated 05/16/2021 >60  >60 mL/min Final   Comment: (NOTE) Calculated using the CKD-EPI Creatinine Equation (2021)    Anion gap 05/16/2021 6  5 - 15 Final   Performed at [facility], [address], [place], [place] [address]   WBC 05/16/2021 6.9  4.0 - 10.5 K/uL Final   RBC 05/16/2021 3.71 (A) 4.22 - 5.81 MIL/uL Final   Hemoglobin 05/16/2021 11.3 (A) 13.0 - 17.0 g/dL Final   HCT 44/11/270 36.8 (A) 39.0 - 52.0 % Final   MCV 05/16/2021 99.2  80.0 - 100.0 fL Final   MCH 05/16/2021 30.5  26.0 - 34.0 pg Final   MCHC 05/16/2021 30.7  30.0 - 36.0 g/dL Final   RDW 53/66/4403 14.0  11.5 - 15.5 % Final   Platelets 05/16/2021 327  150 - 400 K/uL Final   nRBC 05/16/2021 0.0  0.0 - 0.2 % Final   Neutrophils Relative % 05/16/2021 61  % Final   Neutro Abs 05/16/2021 4.2  1.7 - 7.7 K/uL Final   Lymphocytes Relative 05/16/2021 27  % Final   Lymphs Abs 05/16/2021 1.9  0.7 - 4.0 K/uL Final   Monocytes Relative 05/16/2021 9  % Final   Monocytes Absolute 05/16/2021 0.6  0.1 - 1.0 K/uL Final  Eosinophils Relative 05/16/2021 3  % Final   Eosinophils Absolute 05/16/2021 0.2  0.0 - 0.5 K/uL Final    Basophils Relative 05/16/2021 0  % Final   Basophils Absolute 05/16/2021 0.0  0.0 - 0.1 K/uL Final   Immature Granulocytes 05/16/2021 0  % Final   Abs Immature Granulocytes 05/16/2021 0.02  0.00 - 0.07 K/uL Final   Performed at [facility], [address], [place], [place] [address]    Allergies: Patient has no known allergies.  PTA Medications: (Not in a hospital admission)   Medical Decision Making  Patient reviewed with Dr. Bronwen Betters.  Inpatient geriatric psychiatric treatment recommended.  Patient involuntarily committed by family, first exam completed by [facility] TTS counselor. Patient will be transported to  [facility] emergency department to await inpatient geriatric psychiatric placement and medical clearance, Dr. Particia Nearing accepting.  Recommend consider CIWA protocol as patient is a poor historian related to alcohol and substance use.  Also recommend olanzapine 5 mg daily to address mood. Additionally consider agitation protocol related to patient's irritable and labile presentation.  Recommendations  Based on my evaluation the patient appears to have an emergency medical condition for which I recommend the patient be transferred to the emergency department for further evaluation.  Lenard Lance, FNP 06/01/21  12:32 PM

## 2021-06-01 NOTE — ED Provider Notes (Signed)
Emergency Medicine Provider Triage Evaluation Note  Todd Neal , a 75 y.o. male  was evaluated in triage.  Pt complains of patient presents under IVC taken out by the son's girlfriend taken out because patient was not take his medications, having hallucinations and delusions.  Patient does not endorse being any pain at this time, unclear if he has been taking his medications.  He has noted left arm swelling and right leg swelling this appears to be at baseline for patient..  Review of Systems  Positive: Hallucinations, delusions Negative: Chest pain, shortness of breath  Physical Exam  BP (!) 188/93 (BP Location: Right Arm)   Pulse 83   Temp 97.6 F (36.4 C) (Oral)   Resp 15   SpO2 98%  Gen:   Awake, no distress   Resp:  Normal effort  MSK:   Moves extremities without difficulty  Other:    Medical Decision Making  Medically screening exam initiated at 4:39 PM.  Appropriate orders placed.  [name] was informed that the remainder of the evaluation will be completed by another provider, this initial triage assessment does not replace that evaluation, and the importance of remaining in the ED until their evaluation is complete.  Patient presents IVC, lab work has been ordered, patient need further work-up.   Carroll Sage, PA-C 06/01/21 1641    Derwood Kaplan, MD 06/01/21 1725

## 2021-06-01 NOTE — BH Assessment (Addendum)
Disposition:   Patient reviewed with Dr. Bronwen Betters.  Inpatient geriatric psychiatric treatment recommended.  Patient involuntarily committed by family, first exam completed by [facility] TTS counselor.  Patient will be transported to  [facility] emergency department to await inpatient geriatric psychiatric placement and medical clearance, Dr. Particia Nearing accepting.   Disposition Counselor referred patient to the following facilities for consideration of bed placement.       Destination Service Provider Request Status Selected Services Address Phone Fax Patient Preferred  [facility]  Pending - Request Sent N/A [address] [place] [address] [phone] --  [facility]  Pending - Request Sent N/A [address] [place] [address] [phone] --  [facility]  Pending - Request Sent N/A [address] [place], [place] [address] [phone] --  [facility]  Pending - Request Sent N/A [address] [place] [address] [phone] --  [facility]  Pending - Request Sent N/A [address] [place], [place] [address] [phone] --  [facility]  Pending - Request Sent N/A [address] [place] [address] [phone] --  [facility]  Pending - Request Sent N/A [id number] [place] [address] [phone] --  [facility]  Pending - Request Sent N/A [address] [place] [address] [phone] --  [facility]  Pending - Request Sent N/A [address] [place], [place] [address] [phone] --  [facility]  Pending - Request Sent N/A [address], [place] [address] [phone] --  [facility]  Pending - Request Sent N/A [address]  [place] [address] [phone] --  [facility]  Pending - Request Sent N/A [address] [place] [address] [phone] --  [facility]  Pending - Request Sent N/A [address], [place] [address] [phone] --  [facility]  Pending - Request Sent N/A [address], [place] [address] [phone] --  [facility]  Pending - Request Sent N/A [address] [place] [address] [phone] --  [facility]  Pending - Request Sent N/A [address] [place] [address] [phone] --  [facility]  Pending - Request Sent N/A [address], [place] [address] [phone] --  [facility]  Pending - Request Sent N/A [address], [place] [address] [phone] --  [facility]  Pending - Request Sent N/A [address] [place] [address] [phone] --  [facility]  Pending - Request Sent N/A [address] [place] [address] [phone] --  [facility]  Pending - Request Sent N/A [address] [place] [address] [phone] --  [facility]  Pending - Request Sent N/A [address] [place] [address] [phone] --  [facility]  Pending - Request Sent N/A [address] [place] [address] [phone] --  [facility]  Pending - Request Sent N/A [address] [place], [place] [address] [phone] --  [facility]  Pending - Request Sent N/A [address], [place] [address] [phone] --  [facility]  Pending - Request Sent N/A [address],  [place] [address] [phone] --  [facility]  Pending - Request Sent N/A [address] [place] [address] [phone] --  [facility]  Pending - Request Sent N/A [address], [place] [address] [phone] --  [facility]  Pending - Request Sent N/A [address] [place] [address] [phone] --  [facility]  Pending - Request Sent N/A [address] [place] [address] [phone] --  [facility]  Pending - Request Sent N/A [address] [place] [address] [phone] --  [facility]  Pending - Request Sent N/A [address] [place] [address] [phone] --

## 2021-06-01 NOTE — ED Notes (Signed)
Patient discharged with [employer].  Ambulated independently to patrol car.  Discharged in stable condition; no acute distress noted.
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
## 2021-06-01 NOTE — ED Triage Notes (Signed)
Patient arrived by [facility] under IVC for manic behavior. Patient is reported to not be sleeping and talking to trees. Patient denies SI/HI thoughts. Patient alert and talking about putting people in a ditch. Left arm and hand swelling as well as right lower leg swelling
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
## 2021-06-02 DIAGNOSIS — S52502D Unspecified fracture of the lower end of left radius, subsequent encounter for closed fracture with routine healing: Secondary | ICD-10-CM | POA: Diagnosis not present

## 2021-06-02 DIAGNOSIS — R69 Illness, unspecified: Secondary | ICD-10-CM | POA: Diagnosis not present

## 2021-06-02 DIAGNOSIS — R4689 Other symptoms and signs involving appearance and behavior: Secondary | ICD-10-CM | POA: Diagnosis not present

## 2021-06-02 MED ORDER — QUETIAPINE FUMARATE 25 MG PO TABS
25.0000 mg | ORAL_TABLET | Freq: Two times a day (BID) | ORAL | Status: DC
  Administered 2021-06-02 – 2021-06-07 (×9): 25 mg via ORAL
  Filled 2021-06-02 (×11): qty 1

## 2021-06-02 MED ORDER — METOPROLOL TARTRATE 25 MG PO TABS
25.0000 mg | ORAL_TABLET | Freq: Once | ORAL | Status: AC
  Administered 2021-06-02: 25 mg via ORAL
  Filled 2021-06-02: qty 1

## 2021-06-02 NOTE — ED Notes (Signed)
Breakfast Ordered 

## 2021-06-02 NOTE — ED Notes (Signed)
Patient called RN to bedside requesting information regarding the reason for him being here. After saying that he was brought form another facility patient started yelling/cursing at RN and would not allow any further explanation. He walked to BR with sitter and returned to room cooperative.
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
## 2021-06-02 NOTE — ED Provider Notes (Signed)
Emergency Medicine Observation Re-evaluation Note  Todd Neal is a 75 y.o. male, seen on rounds today.  Pt initially presented to the ED for complaints of No chief complaint on file. Currently, the patient is resting.  Physical Exam  BP (!) 159/86 (BP Location: Right Arm)   Pulse 94   Temp 98.3 F (36.8 C)   Resp 20   SpO2 100%  Physical Exam General: resting comfortably, NAD Lungs: normal WOB Psych: currently calm and resting  ED Course / MDM  EKG:   I have reviewed the labs performed to date as well as medications administered while in observation.  Recent changes in the last 24 hours include nonce.  Plan  Current plan is for placement with geripsych. Patient is under full IVC at this time.   Rozelle Logan, DO 06/02/21 1024

## 2021-06-02 NOTE — ED Notes (Signed)
Patient was given a Snack and drink. 

## 2021-06-02 NOTE — Progress Notes (Signed)
Patient has been recommended for geri psych and has been faxed out. Patient meets inpatient criteria per Inetta Fermo Allen,NP. Patient referred to the following facilities:  [facility]  [address] [place] [address] [phone]  [facility]  [address] [place] [address] [phone]  [facility]  [address] [place], [place] [address] [phone]  [facility]  [address] [place] [address] [phone]  [facility]  [address] [place], [place] [address] [phone]  [facility] [place] [address] [phone]  [facility]  [address] [place] [address] [phone]  [facility]  [address], [place] [address] [phone]  [facility]  [address] [place], [place] [address] [phone]  [facility]  [address] [place] [address] [phone]  [facility]  [address] [place] [address] [phone]  [facility]  [address], [place] [address] [phone]  [facility]  [address] [place] [address] [phone]  [facility]  [address] [place] [address] [phone]  [facility]  [address] [place] [address] [phone]  [facility]  [address] [place] [address] [phone]  [facility]  [address], [place] [address] [phone]  [facility]  [address] [place] [address] [phone]   [facility]  [address] [place] [address] [phone]  [facility]  [address], [place] [address] [phone]  [facility]  [address], [place] [address] [phone]  [facility]  [address] [place], [place] [address] [phone]  [facility]  [address], [place] [address] [phone]  [facility]  [address] [place] [address] [phone]  [facility]  [address] [place] [address] [phone]  [facility] [address]  [address], [place] [address] [phone]  [facility]  [address] [place] [address] [phone]  [facility]  [address] [place] [address] [phone]  [facility]  [address] [place] [address] [phone]  [facility]  [address] [place] [address] [phone]  [facility]  [address], [place] [address] [phone]  [facility]  [address] [place] [address] [phone]    CSW will continue to monitor disposition.    Damita Dunnings, MSW, LCSW-A  9:45 AM 06/02/2021

## 2021-06-02 NOTE — ED Notes (Signed)
Dr Wilkie Aye notified of patient status and elevated BP 190/95, asymptomatic. No new orders received.
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
## 2021-06-03 DIAGNOSIS — S52502D Unspecified fracture of the lower end of left radius, subsequent encounter for closed fracture with routine healing: Secondary | ICD-10-CM | POA: Diagnosis not present

## 2021-06-03 DIAGNOSIS — R4689 Other symptoms and signs involving appearance and behavior: Secondary | ICD-10-CM | POA: Diagnosis not present

## 2021-06-03 MED ORDER — HYDROCHLOROTHIAZIDE 25 MG PO TABS
25.0000 mg | ORAL_TABLET | Freq: Every day | ORAL | Status: DC
  Administered 2021-06-03 – 2021-06-07 (×5): 25 mg via ORAL
  Filled 2021-06-03 (×5): qty 1

## 2021-06-03 MED ORDER — AMLODIPINE BESYLATE 5 MG PO TABS
5.0000 mg | ORAL_TABLET | Freq: Every day | ORAL | Status: DC
  Administered 2021-06-03 – 2021-06-07 (×5): 5 mg via ORAL
  Filled 2021-06-03 (×5): qty 1

## 2021-06-03 NOTE — BH Assessment (Addendum)
Disposition:  TTS reassessment. Pt is under review at [facility] @1013 .  CSW confirmed with [facility] at [phone] that the Pt is still under review. @1648 , patient re-faxed to outside hospitals for consideration of bed placement.    See listed hospitals as noted below.   -Destination-  Service Provider Request Status Selected Services Address Phone Fax Patient Preferred  [facility]  Pending - Request Sent N/A [address], [place] [phone] --  [facility]  Pending - Request Sent N/A [address] [place] [address] [place] [phone] --  [facility]  Pending - Request Sent N/A [address] [place], [address] [place] [phone] --  [facility]  Pending - Request Sent N/A [address] [phone] [address] Rolene Arbour [phone] --  [facility]  Pending - Request Sent N/A [address] [place], [place] [phone] --  [facility]  Pending - Request Sent N/A [facility] [address] [facility] P[place]illa Larsson [phone] --  [facility]  Pending - Request Sent N/A [phone] [address] [place] [phone] --  [facility]  Pending - Request Sent N/A [address] [place] [address] [place] [phone] --  [facility]  Pending - Request Sent N/A [address] [place], [address] [place] [phone] --  [facility]  Pending - Request Sent N/A [address] [facility] [address] Rande Lawman [phone] --  [facility]  Pending - Request Sent N/A [address] [place] [phone] [address] [place] [phone] --  [facility]  Pending - Request Sent N/A [address] [facility] [place] [address] [address] [place] [phone] --  [facility]  Pending - Request Sent N/A [address] [place] [address] [place] [phone] --  [facility]  Pending - Request Sent N/A [address] [place] [address] [place] [phone] --  [facility]  Pending - Request Sent N/A [address] [facility] [address] [place] [address] [place] [phone] --  [facility]  Pending - Request Sent N/A [address] [facility] [employer] [facility] Karolee [place] [phone] --  [facility]  Pending - Request Sent N/A [address] [place] [address] [place] [phone] --  [facility]  Pending - Request Sent N/A [address] [place] [address] [place] [phone] --  [facility]  Pending - Request Sent N/A [address] [place] [address] [place] [address] [place] [phone] --  [facility]  Pending - Request Sent N/A [address], [place] [address] [place] [phone] --  [facility]  Pending - Request Sent N/A [address], [place] [address] [place] [phone] --  [facility]  Pending - Request Sent N/A [address] [facility] [address] [place]ie [place] [phone] --  [facility]  Pending - Request Sent N/A [address] [place] [facility] [address] [phone] --  [facility]  Pending - Request Sent N/A [address] [place], [place] [phone] --  [facility]  Pending - Request Sent N/A [address], [place] [address] [place] [phone] --  [facility]  Pending - Request Sent N/A [address] [place] [address] [place] [phone] --  [facility]  Pending - Request Sent N/A [address] [facility] [address] [place] [place] [phone] --  [facility]  Pending - Request Sent N/A [address] [place] [address] [phone] --  [facility]  Pending - Request Sent N/A [address] Maryl[place] [address] [phone] --  [facility]  Pending - Request Sent N/A [address], [place] [address] [phone] --  [facility]  Pending - Request Sent N/A [address], [place] [address] [phone] --  [facility]  Pending - Request Sent N/A [address] [place] [address] [phone] --

## 2021-06-03 NOTE — ED Notes (Signed)
While taken Vitals , this NT noticed that PT was not using left hand . PT words were not clear , he appeared to have a language disorder, which would come and go with certain words. This NT  would have to determine the PT's wants by, asking an array of possible needs until the PT said yeh! This was passed on to RN.

## 2021-06-03 NOTE — ED Provider Notes (Signed)
Emergency Medicine Observation Re-evaluation Note  Todd Neal is a 75 y.o. male, seen on rounds today.  Pt initially presented to the ED for complaints of decompensation of his bipolar disorder. Currently, the patient is resting.  Physical Exam  BP (!) 193/94 (BP Location: Right Arm)   Pulse (!) 53   Temp (!) 97.4 F (36.3 C) (Oral)   Resp 13   Ht 5\' 11"  (1.803 m)   Wt 71 kg   SpO2 100%   BMI 21.83 kg/m  Physical Exam General: resting comfortably, NAD Lungs: normal WOB Psych: currently calm and resting  ED Course / MDM  EKG:   I have reviewed the labs performed to date as well as medications administered while in observation.  Recent changes in the last 24 hours include nonce.  The nurse was notified by psychiatry that his blood pressure was too high to be placed.  For some reason his home blood pressure medicine was never restarted I will restart this at this time.  I also placed him on a low dose of amlodipine.  Plan  Current plan is for placement with geripsych. Patient is under full IVC at this time.   , DO 06/02/21 1024    06/04/21, DO 06/03/21 [phone]

## 2021-06-03 NOTE — BH Assessment (Signed)
Disposition:   Todd Neal is considering patient for admission and requesting the following items:   UA, copy of the IVC, BAL, H&P, updated vital signs, and documentation stating that patient is medically cleared. Contact # [phone] #[phone]. Notified nursing Cala Bradford, RN) and EDP (Dr. Silverio Lay). Information to be faxed by nursing once everything has resulted and gathered.

## 2021-06-04 DIAGNOSIS — S52502D Unspecified fracture of the lower end of left radius, subsequent encounter for closed fracture with routine healing: Secondary | ICD-10-CM | POA: Diagnosis not present

## 2021-06-04 DIAGNOSIS — R4689 Other symptoms and signs involving appearance and behavior: Secondary | ICD-10-CM | POA: Diagnosis not present

## 2021-06-04 LAB — URINALYSIS, ROUTINE W REFLEX MICROSCOPIC
Bilirubin Urine: NEGATIVE
Glucose, UA: NEGATIVE mg/dL
Hgb urine dipstick: NEGATIVE
Ketones, ur: NEGATIVE mg/dL
Leukocytes,Ua: NEGATIVE
Nitrite: NEGATIVE
Protein, ur: NEGATIVE mg/dL
Specific Gravity, Urine: 1.008 (ref 1.005–1.030)
pH: 7 (ref 5.0–8.0)

## 2021-06-04 MED ORDER — HALOPERIDOL LACTATE 5 MG/ML IJ SOLN
5.0000 mg | Freq: Once | INTRAMUSCULAR | Status: AC
  Administered 2021-06-04: 5 mg via INTRAMUSCULAR
  Filled 2021-06-04: qty 1

## 2021-06-04 MED ORDER — LORAZEPAM 2 MG/ML IJ SOLN
1.0000 mg | Freq: Once | INTRAMUSCULAR | Status: AC
  Administered 2021-06-04: 1 mg via INTRAMUSCULAR
  Filled 2021-06-04: qty 1

## 2021-06-04 NOTE — ED Provider Notes (Signed)
Emergency Medicine Observation Re-evaluation Note  Todd Neal is a 75 y.o. male, seen on rounds today.  Pt initially presented to the ED for complaints of  behavioral symptoms, periods of agitated behavior. Currently calm and alert, eating/drinking, nad.   Physical Exam  BP (!) 164/75 (BP Location: Right Arm)   Pulse (!) 57   Temp 97.9 F (36.6 C) (Oral)   Resp 20   Ht 1.803 m (5\' 11" )   Wt 71 kg   SpO2 100%   BMI 21.83 kg/m  Physical Exam General: calm, alert.  Cardiac: regular rate Lungs: breathing comfortably Psych: calm, normal mood/affect. Denies SI/HI. Currently does not appear to be responding to internal stimuli.   ED Course / MDM  EKG:   I have reviewed the labs performed to date as well as medications administered while in observation.  Recent changes in the last 24 hours include medication management, BH reassessment and placement efforts.   Plan  Current plan is for medication management, stabilization, BH reassessment and placement.      , MD 06/04/21 1234

## 2021-06-04 NOTE — ED Notes (Signed)
This RN faxed requested documents to [facility]. Fax number [phone] and facility contact number [phone]. This RN also called to notify facility of pending fax and callback name/number left in the event that any questions may arise.

## 2021-06-04 NOTE — ED Notes (Signed)
Pt displaying signs of agitation and verbal aggression by refusing to stay in room, yelling vulgar sentences loudly and threatening physical harm to staff who are attempting to help pt. Provider made aware of the same and at bedside to assess pt. Security called to room for additional safety measures.

## 2021-06-04 NOTE — ED Provider Notes (Signed)
Asked by nursing staff to evaluate patient due to severe agitation.  Patient threatening to harm staff.  He is currently under IVC.  Will administer Haldol and Ativan, reevaluate as needed.   Gilda Crease, MD 06/04/21 0200

## 2021-06-04 NOTE — BH Assessment (Signed)
This Probation officer met with patient this date to assess current mental health status. Patient is observed to have his blanket over his head as this Probation officer spoke with him. Patient when asked would not remove it and appeared to be agitated. Patient is just saying random abusive words to this Probation officer and is giving answers unrelated to this writer's questions. Patient will not respond when asked in reference to S/I, H/I or AVH. Per note review this seems to be consistent with ongoing behaviors. Patient speech is garbled and unintelligible at times. Patient's memory is impaired and thoughts disorganized. Per Bobby Rumpf FNP patient contnues to meet inpatient criteria as ongoing placement is investigated.

## 2021-06-05 DIAGNOSIS — S52502D Unspecified fracture of the lower end of left radius, subsequent encounter for closed fracture with routine healing: Secondary | ICD-10-CM | POA: Diagnosis not present

## 2021-06-05 DIAGNOSIS — R4689 Other symptoms and signs involving appearance and behavior: Secondary | ICD-10-CM | POA: Diagnosis not present

## 2021-06-05 NOTE — ED Notes (Signed)
Pt has showered at this time. Given fresh scrubs and socks. Pt is getting dressed in the bathroom. Pt calm and cooperative and is responding to internal stimuli entire time.

## 2021-06-05 NOTE — ED Notes (Signed)
IVC/Inpatient Karen Kitchens friend [phone] would like to speak to the patient

## 2021-06-05 NOTE — Progress Notes (Signed)
[facility] contacted CSW in reference to the patient. There are no available beds.  Akim Watkinson, MSW, LCSW-A, LCAS-A Phone: [phone] Disposition/TOC  

## 2021-06-05 NOTE — ED Notes (Signed)
Breakfast Ordered 

## 2021-06-05 NOTE — ED Provider Notes (Signed)
Emergency Medicine Observation Re-evaluation Note  Todd Neal is a 75 y.o. male, seen on rounds today.  Pt initially presented to the ED for complaints of behavioral symptoms. BH team continues to work on placement. No new issues or c/o this AM.   Physical Exam  BP (!) 142/79 (BP Location: Left Arm)   Pulse (!) 57   Temp 97.8 F (36.6 C) (Oral)   Resp 15   Ht 1.803 m (5\' 11" )   Wt 71 kg   SpO2 100%   BMI 21.83 kg/m  Physical Exam General: calm, alert.  Cardiac: regular rate Lungs: breathing comfortably Psych: calm, alert, not agitated, not aggressive.   ED Course / MDM  EKG:   I have reviewed the labs performed to date as well as medications administered while in observation.  Recent changes in the last 24 hours include BH team reassessment.   Plan  Current plan is for stabilization on meds, BH reassessment - currently inpatient psych care being recommended.      , MD 06/05/21 [phone]

## 2021-06-05 NOTE — Progress Notes (Addendum)
Per Tilford Pillar, patient meets criteria for inpatient treatment. There are no available or appropriate beds at [facility] today. CSW faxed referrals to the following facilities for review:  [facility]  TTS will continue to seek bed placement.  Crissie Reese, MSW, LCSW-A, LCAS-A Phone: [phone] Disposition/TOC

## 2021-06-06 ENCOUNTER — Encounter (HOSPITAL_COMMUNITY): Payer: Self-pay | Admitting: Registered Nurse

## 2021-06-06 DIAGNOSIS — S52502D Unspecified fracture of the lower end of left radius, subsequent encounter for closed fracture with routine healing: Secondary | ICD-10-CM | POA: Diagnosis not present

## 2021-06-06 DIAGNOSIS — R4689 Other symptoms and signs involving appearance and behavior: Secondary | ICD-10-CM | POA: Diagnosis not present

## 2021-06-06 NOTE — Progress Notes (Signed)
APS report was made.  

## 2021-06-06 NOTE — Progress Notes (Signed)
Patient has no medicaid and is unable able to pay out of pocket to go to an assisted living facility. CSW had previously spoke to the family last month in attempts to get them to apply for medicaid for patient. CSW also left a message for APS. CSW called in an APS case last month and case was screened out due to similar concerns for this hospital stay. Patient will have to be discharged with homeless resources. APS was called today and CSW left a message.

## 2021-06-06 NOTE — Consult Note (Signed)
Telepsych Consultation   Reason for Consult:  delusions, hallucinations Referring Physician:  Cathren Laine, MD Location of Patient: [facility] ED Location of Provider: Other: [facility]  Patient Identification: ANGAS ISABELL MRN:  [id number] Principal Diagnosis: Bipolar I disorder (HCC) Diagnosis:  Principal Problem:   Bipolar I disorder (HCC)  Total Time spent with patient: 30 minutes  Subjective:   ALPHONZO DEVERA is a 75 y.o. male patient admitted [facility] ED after he was sent from [facility] after he arrived under IVC petition by patient's son's girlfriend with complaints that patient wasn't taking his medications, he was having hallucinations, and delusions.  for manic behavior.    HPI:  MALIK PAAR, 75 y.o., male patient seen via tele health by this provider, consulted with Dr. Nelly Rout; and chart reviewed on 06/06/21.  On evaluation VIDIT BOISSONNEAULT reports I'm doing fine.  I had a good breakfast now I'm ready for lunch."  Patient asked if he knew why he was brought to the hospital and he stated "Yeah I know.  You know I got a blood condition and I was taking vinegar instead of taking the pill.  You know vinegar will bring it down quick."  Discussed with patient that his blood pressure had been running high and that he would still need to take his medication even if he too teaspoon of vinegar everyday.  Discussed the importance of taking medications.  Patient then stats "They don't want me running around in the streets picking up trash and cans."  Patient states he was living with a lady named kelly "But she got mad.  She don't like you telling her what she do wrong.  She stay mad all the time."  When asked who didn't want him in the streets he stated "My son.  I call him triple A cause his name is Tayte Mcwherter."  Patient reporting he has been sleeping well and eating well. States he is taking his medication without adverse reaction.  Patient denies suicidal/self-harm/homicidal ideation, psychosis,  and paranoia.  When asked about homicidal ideation patient made the statement "The only person I would want to hurt is a man beating on a woman."  When assessing patient for orientation he was able to give correct answers to questions.  Patient also talked about "I came to [facility] when I was straight out of high school.  There was alligator eating people.  I said look a here alligator you cross my patient, you'll be skint and on the table."  Patient asked what he meant when discussing alligator eating people and he stated, "When I first moved here [facility]. was a swamp."  Patient reported he was born in [place].  Patient was able to carry an appropriate conversation and stating he enjoyed talking.  Patient reporting, he didn't mind staying in the hospital because "I don't have no where else to go and get 3 meals a day.  Patient talked about his favorite show that comes on, on Monday nights at 9:00 PM.  During evaluation [name] is sitting up in bed in no acute distress.  He is alert, oriented x 4.  Patient was able to give the correct information for DOB, place, city, state, country, day or week, and month.  Patient was unable to give the names of the last 3 presidents stating he would have to "look that up."  When asked how long he had been in the hospital Patient stated "I'm not sure but  I can tell ya.  You get 3 meals a day and every time you get a meal they give you one of these (referring to a receipts he had kept put away with is things.  He took out then counted).  I got 3 of these so that is at least one day."  Patient is  calm, cooperative and his  mood is pleasant and euthymic with congruent affect.  He does not appear to be responding to internal/external stimuli or delusional thoughts.  Patient denies suicidal/self-harm/homicidal ideation, psychosis, and paranoia.  Patient answered question appropriately.  Patient homeless and unsure if patient is able to care for himself if living on  the streets.  Patient would be more appropriate in assisted living.  Patient doesn't appear to be able to make the arrangements or contacts to get himself in an assisted living facility.  May be a discussion that social work/TOC can have with patients family.  Patient has been in ED/urgent care several time with similar presentation altered mental status. Patient unable to care for self in street an would benefit from assisted living.     According to medical record patient [facility] when brought in by his son with history of dementia newly diagnosed, at that time he was also rambling, incoherent, slurred speech.  Collateral from patients son is that patient has been homeless for 4 months.     Past Psychiatric History: Bipolar one disorder, aggressive behavior  Risk to Self:  Denies Risk to Others:  Denies Prior Inpatient Therapy: Yes Prior Outpatient Therapy:  Yes, denies at this time  Past Medical History:  Past Medical History:  Diagnosis Date   Hypertension    History reviewed. No pertinent surgical history. Family History: History reviewed. No pertinent family history. Family Psychiatric  History: Unaware Social History:  Social History   Substance and Sexual Activity  Alcohol Use No   Alcohol/week: 0.0 standard drinks     Social History   Substance and Sexual Activity  Drug Use No    Social History   Socioeconomic History   Marital status: Married    Spouse name: Not on file   Number of children: Not on file   Years of education: Not on file   Highest education level: Not on file  Occupational History   Not on file  Tobacco Use   Smoking status: Every Day    Packs/day: 0.50    Years: 55.00    Pack years: 27.50    Types: Cigarettes   Smokeless tobacco: Never  Substance and Sexual Activity   Alcohol use: No    Alcohol/week: 0.0 standard drinks   Drug use: No   Sexual activity: Never    Birth control/protection: Abstinence  Other Topics Concern    Not on file  Social History Narrative   Not on file   Social Determinants of Health   Financial Resource Strain: Not on file  Food Insecurity: Not on file  Transportation Needs: Not on file  Physical Activity: Not on file  Stress: Not on file  Social Connections: Not on file   Additional Social History:    Allergies:  No Known Allergies  Labs: No results found for this or any previous visit (from the past 48 hour(s)).  Medications:  Current Facility-Administered Medications  Medication Dose Route Frequency Provider Last Rate Last Admin   amLODipine (NORVASC) tablet 5 mg  5 mg Oral Daily Melene PlanFloyd, Dan, DO   5 mg at 06/06/21 [phone]  hydrochlorothiazide (HYDRODIURIL) tablet 25 mg  25 mg Oral Daily Melene Plan, DO   25 mg at 06/06/21 0950   QUEtiapine (SEROQUEL) tablet 25 mg  25 mg Oral BID Ardis Hughs, NP   25 mg at 06/06/21 0240   Current Outpatient Medications  Medication Sig Dispense Refill   cephALEXin (KEFLEX) 500 MG capsule Take 1 capsule (500 mg total) by mouth 4 (four) times daily. (Patient not taking: No sig reported) 20 capsule 0   hydrochlorothiazide (HYDRODIURIL) 25 MG tablet Take 1 tablet (25 mg total) by mouth daily. (Patient not taking: Reported on 06/01/2021) 90 tablet 3   HYDROcodone-acetaminophen (NORCO/VICODIN) 5-325 MG tablet Take 1 tablet by mouth every 6 (six) hours as needed for moderate pain. (Patient not taking: No sig reported) 10 tablet 0    Musculoskeletal: Strength & Muscle Tone: within normal limits Gait & Station: normal Patient leans: N/A  Psychiatric Specialty Exam:  Presentation  General Appearance: Appropriate for Environment  Eye Contact:Good  Speech:Clear and Coherent; Normal Rate  Speech Volume:Normal  Handedness:Right   Mood and Affect  Mood:Euthymic  Affect:Appropriate; Congruent   Thought Process  Thought Processes:Coherent; Goal Directed  Descriptions of Associations:Tangential  Orientation:Full (Time, Place and  Person)  Thought Content:WDL  History of Schizophrenia/Schizoaffective disorder:No  Duration of Psychotic Symptoms:Greater than six months  Hallucinations:Hallucinations: None Ideas of Reference:None  Suicidal Thoughts:Suicidal Thoughts: No Homicidal Thoughts:Homicidal Thoughts: No  Sensorium  Memory:Recent Fair; Immediate Fair  Judgment:Fair  Insight:Fair; Present   Executive Functions  Concentration:Fair  Attention Span:Fair  Recall:Fair  Fund of Knowledge:Fair  Language:Fair   Psychomotor Activity  Psychomotor Activity: Psychomotor Activity: Normal  Assets  Assets:Communication Skills; Desire for Improvement; Resilience   Sleep  Sleep: Sleep: Good   Physical Exam: Physical Exam Vitals and nursing note reviewed. Exam conducted with a chaperone present.  Constitutional:      General: He is not in acute distress.    Appearance: Normal appearance. He is not ill-appearing.  Cardiovascular:     Rate and Rhythm: Normal rate.     Comments: Elevated blood pressure Pulmonary:     Effort: Pulmonary effort is normal.  Neurological:     Mental Status: He is alert and oriented to person, place, and time.  Psychiatric:        Attention and Perception: Attention and perception normal. He does not perceive auditory or visual hallucinations.        Mood and Affect: Mood and affect normal.        Speech: Speech normal.        Behavior: Behavior normal. Behavior is cooperative.        Thought Content: Thought content normal. Thought content is not paranoid or delusional. Thought content does not include homicidal or suicidal ideation.        Cognition and Memory: Cognition normal.        Judgment: Judgment is impulsive.   Review of Systems  Constitutional: Negative.   HENT: Negative.    Eyes: Negative.   Respiratory: Negative.    Cardiovascular: Negative.  Negative for chest pain and palpitations.  Gastrointestinal: Negative.   Genitourinary: Negative.    Musculoskeletal: Negative.   Skin: Negative.   Neurological: Negative.   Endo/Heme/Allergies: Negative.   Psychiatric/Behavioral:  Depression: Stable. Hallucinations: Denies. Memory loss: Reporting some problems with remebering things sometimes. Substance abuse: Denies at this time; but states he did smoke a blunt couple of days ago.  Also reported that he done drugs when he was younger but not  now. Suicidal ideas: Denies suicidal ideation; and states he has never tried to kill himself. Nervous/anxious: Stable. Insomnia: Reports he has been sleeping fine while in ED.        Patient reporting that he feels good.  Patient also stating that he has no where to go and that his family doesn't want him on the streets.      Blood pressure (!) 154/75, pulse 75, temperature 98.3 F (36.8 C), temperature source Oral, resp. rate 18, height 5\' 11"  (1.803 m), weight 71 kg, SpO2 97 %. Body mass index is 21.83 kg/m.  Treatment Plan Summary: Plan Psychiatrically clear. Social work/TOC to assist/instruct patient and family on course/steps for placing patient in assisted living or facility.  Patient unable to do himself.   Social work/TOC consult ordered informing:  Patient is homeless and unable to care for himself on the streets; he has been in the ED several times with altered mental status and high blood pressure.  Patient needs assisted living or family care home placement.  May be able to work with patients son for placement; but don't recommend patient being discharged to the street. Patient also needs outpatient psychiatric services for medication management  Disposition: No evidence of imminent risk to self or others at present.   Patient does not meet criteria for psychiatric inpatient admission. Supportive therapy provided about ongoing stressors. Discussed crisis plan, support from social network, calling 911, coming to the Emergency Department, and calling Suicide Hotline. Outpatient psychiatric  services for medication management and Family Care Home or Assisted living placement  This service was provided via telemedicine using a 2-way, interactive audio and video technology.  Names of all persons participating in this telemedicine service and their role in this encounter. Name: , NP Role: PMHNP  Name: Dr. Assunta Found Role: Psychiatrist  Name: Nelly Rout Role: Patient  Name: Nelida Gores, RN Role: Patient's nurse sent a secure message informing: Psychiatric consult completed.   Patient psychiatrically cleared.  Social work/TOC consult ordered.  Patient is homeless and unable to care for himself on the streets; he has been in the ED several times with altered mental status and high blood pressure.  Patient needs assisted living or family care home placement.  May be able to work with patient's son for placement; but don't recommend patient being discharged to the street.  Patient will also need outpatient psychiatric services for medication management, and prescription for Seroquel.  Please inform MD only default listed.        Mako Pelfrey, NP 06/06/2021 1:28 PM

## 2021-06-06 NOTE — Progress Notes (Signed)
CSW contacted patients son. Unable to leave an voicemail. CSW sent a text message. CSW contacted both numbers listed for his sisters. Both numbers are out of service.

## 2021-06-06 NOTE — ED Provider Notes (Signed)
Emergency Medicine Observation Re-evaluation Note  Todd Neal is a 75 y.o. male, seen on rounds today.  Pt initially presented to the ED for complaints of No chief complaint on file. Currently, the patient is awaiting placement. No complaints this morning. Reports feeling well. Ate breakfast.  Physical Exam  BP (!) 143/76 (BP Location: Left Arm)   Pulse 60   Temp 97.8 F (36.6 C) (Oral)   Resp 14   Ht 5\' 11"  (1.803 m)   Wt 71 kg   SpO2 99%   BMI 21.83 kg/m  Physical Exam General: No distress, well appearance Cardiac: regular, 2+ SEM Lungs: CTA, no resp distress Psych: alert, pleasantly interactive, some tangential rambling  ED Course / MDM  EKG:   I have reviewed the labs performed to date as well as medications administered while in observation.  Recent changes in the last 24 hours include none.  Plan  Current plan is for placement. Patient is under full IVC at this time.   , MD 06/06/21 [phone]

## 2021-06-06 NOTE — ED Provider Notes (Signed)
75 year old man who had been seen and committed bizarre behavior and not taking his medications.  He has been here for 5 days and psychiatry has cleared him today.  However he continues to have bizarre conversations talking about alligator people and the fact that he owns a home and he owns the cab Company in [place].  He does not appear to be competent to be discharged back to the street.  Plan to obtain APS consult and will discuss again with psychiatry regarding competency.   Margarita Grizzle, MD 06/06/21 2103

## 2021-06-06 NOTE — Progress Notes (Signed)
CSW received call from son [name] @ [phone].   CSW explained to son that Pt had been cleared by [facility]. CSW explained that currently Pt is still in the ED pending APS investigation.  Son states that he is POA. He further reports that he has not begun the Medicaid application process as was discussed with CSW [place] on 6/30.  Son also states that Pt's checks have been routed to son's mailing address but declined to give that address to CSW due to son's concern that people are are stealing checks out of the mailbox.   CSW informed son that APS has been notified.

## 2021-06-06 NOTE — Progress Notes (Signed)
CSWs met with Pt at bedside. Pt is alert and oriented.  Pt was asked if he wished to go to an assisted living facility, Pt states that he would like to go.  Pt was asked if he would be willing to use his SSI to pay for ALF, he states that he des not wish to use his check for such purposes. Pt reports that he can stay on the streets, that he can also find a place to stay and that he does not need an ALF. Pt states that his check arrives at his post office box.   TOC leadership updated.  CSW was unable to reach son by either call or text message.

## 2021-06-06 NOTE — ED Notes (Signed)
rescind paper given to MD

## 2021-06-06 NOTE — Progress Notes (Signed)
CSW returned to bedside with EDP.  Pt is not oriented at this time. Pt now speaking about alligators in [place] and other delusional ideations such as owning all of the cab companies in [place] as well.  CSW updated [facility] leadership as to new development in case.  CSW received call from APS that case has been screened in.

## 2021-06-06 NOTE — Progress Notes (Signed)
Patient has been faxed out due to him being recommended for geri psych. Patient meets inpatient criteria per Tanika Lewis,NP. Patient referred to the following facilities:  [facility]  [address], [place] [address] [phone]  [facility]  [address] [place] [address] [phone]  [facility]  [address] [place], [place] [address] [phone]  [facility]  [address] [place] [address] [phone]  [facility]  [address] [place], [place] [address] [phone]  [facility] [address] [place] [address] [phone]  [facility]  [address] [place] [address] [phone]  [facility]  [address], [place] [address] [phone]  [facility]  [address] [place], [place] [address] [phone]  [facility]  [address] [place] [address] [phone]  [facility]  [address], [place] [address] [phone]  [facility]  [address], [place] [address] [phone]  [facility]  [address], [place] [address] [phone]  [facility]  [address] [place] [address] [phone]  [facility]  [address] [place] [address] [phone]  [facility]  [address] [place] [address] [phone]  [facility]  [address] [place] [address] [phone]  [facility]  [address], [place] [address] [phone]  [facility]  [address], [place] [address] [phone]  [facility]  [address], [place] [address] [phone]  [facility]  [address], [place] [address] [phone]  [facility]  [address] [place], [place] [address] [phone]  [facility]  [address], [place] [address] [phone]  [facility]  [address], [place] [address] [phone]  [facility]  [address] [place] [address] [phone]  [facility] [address]  [address] [place] [address] [phone]  [facility]  [address] [place] [address] [phone]  [facility]  [address] [place] [address] [phone]  [facility]  [address] [place] [address] [phone]  [facility]  [address] [place] [address] [phone]  [facility]  [address], [place] [address] [phone]  [facility]  [address] [place] [address] [phone]  [facility]  [address] [place] [address] [phone]  [facility]  [address] [place] [address] [phone]  [facility]  [address] [place] [address] [phone]    CSW will continue to monitor disposition.     Damita Dunnings, MSW, LCSW-A  7:31 AM 06/06/2021

## 2021-06-07 DIAGNOSIS — F319 Bipolar disorder, unspecified: Secondary | ICD-10-CM

## 2021-06-07 DIAGNOSIS — R69 Illness, unspecified: Secondary | ICD-10-CM | POA: Diagnosis not present

## 2021-06-07 DIAGNOSIS — S52502D Unspecified fracture of the lower end of left radius, subsequent encounter for closed fracture with routine healing: Secondary | ICD-10-CM | POA: Diagnosis not present

## 2021-06-07 DIAGNOSIS — R4689 Other symptoms and signs involving appearance and behavior: Secondary | ICD-10-CM | POA: Diagnosis not present

## 2021-06-07 MED ORDER — QUETIAPINE FUMARATE 25 MG PO TABS: 25.0000 mg | ORAL_TABLET | Freq: Two times a day (BID) | ORAL | 1 refills | Status: DC

## 2021-06-07 NOTE — Progress Notes (Signed)
CSW contacted Social Security to find out where patients money is going. Patients keeps stating its going to a P.O. Box but he cannot remember the number and does not have a key. Patients son also would not tell second shift Child psychotherapist where the checks were going to and was afraid that someone would steal them. Patient was able to tell social security his name, DOB, zip code, and city. Patients was able to name an address of [address]. Social security stated they have that address on file. CSW named [address] that's listed in the chart and social security stated that they do not have that address on file. Social security was not able to tell CSW what address or where patients checks are going.Social security stated that the chjecks are being mailed and its not a direct deposit. CSW was told patient gets $1905 a month. Social security put out an alert for an investigation on patients checks and for the son to be removed as the payee. CSW was told to contact the local office in [place] as well to get further assistance.

## 2021-06-07 NOTE — Discharge Instructions (Signed)
Follow up with your md next week. °

## 2021-06-07 NOTE — Consult Note (Signed)
Telepsych Consultation   Reason for Consult:  delusions, hallucinations Referring Physician:  Cathren Laine, MD Location of Patient: [facility] ED Location of Provider: Other: [facility]  Patient Identification: ALPHA MYSLIWIEC MRN:  [id number] Principal Diagnosis: Bipolar I disorder (HCC) Diagnosis:  Principal Problem:   Bipolar I disorder (HCC)  Total Time spent with patient: 30 minutes  Subjective:   DAREN YEAGLE is a 75 y.o. male patient admitted [facility] ED after he was sent from [facility] after he arrived under IVC petition by patient's son's girlfriend with complaints that patient wasn't taking his medications, he was having hallucinations, and delusions.  for manic behavior.     Follow up Psychiatric Consult 06/07/2098: It was noted that another psychiatric consult was order related to patient talking about alligator, asking a nurse what tribe she was from, and report from a nurse that patient had been talking to himself.  Social work also asking if patient has "capacity".  Secure message sent to social work/TOC informing:  As a NP we are not allowed to assess capacity.  I can say that patient is oriented and able to answer most questions logically.  He has been diagnosed with dementia so may not give the correct information about some things.  Patient is aware that he needs someone to help him and drive him around cause he can't do it himself like he use to.  Don't feel that he is able to live a lone or live on the streets and appropriated care for himself.      [name], 75 y.o., male patient reassessed via tele health by this provider, consulted with Dr. Nelly Rout; and chart reviewed on 06/07/21.  [facility] adult protective services social worker  Levora Angel and [facility] social worker MS Cotton was in the patients room.  Listened to APS ask question related to patients orientation, where he lived, about his children.  Patient answered questions.  Patient given an address to where he lived stating it  was a house and a Musician.  Patient also giving some of the answers given the prior day when discussing son's girlfriend getting mad about hair on kitchen table when he told her that hair didn't belong in the kitchen.  Patient reporting that his son Adit Riddles "does everything I don't want him to do; but he comes back and do the right thing.  Patient will raise his voice he thinks some one is talking over him.  While APS worker speaking after asking about patients home address patient stated "Slow down and let me tet a word in. Now you tell me my address, you already know it.  I own that house to.  Its a house and a restaurant.  On evaluation KERRY CHISOLM reporting that he gets a Radio producer but doesn't feel that his son is Airline pilot and spending money.  "He get it but can't cash it.  You let him try.  He better not or you'll hear the police.  Patient was asked if he had been nice to the nurses and patient states "Yes sir I been nice.  I just don't like when people cut me off."  This Clinical research associate asked patient about the alligators and he stated "Yeah there are alligators in [place] eating people."  Then patient made a statement about the "boogie bear better get on the bus."  While patient is talking about the alligators and boogie bear he is laughing and joking.   Informed APS and social  worker that patient continues to be psychiatrically cleared but did not feel that patient should be discharged to the street.  Patient unable to care for himself adequate homeless in the street.  APS informing he will contacts patient's son.  Stating that patient gets enough money on his social security that he shouldn't be homeless.  Wanting to check in on where patients money is going and who is caring or helping patient, where patient lives.   During evaluation DECARLO RIVET is sitting up in bed in no acute distress.  He is alert, oriented x 4, calm and cooperative.  His mood is pleasant and euthymic with  congruent affect.  He does not appear to be responding to internal/external stimuli or delusional thoughts; and he denies suicidal/self-harm/homicidal ideation, psychosis, and paranoia.  Patient does make odd statements talking about alligators and boogie bears.  During the two assessment provide by this writer there has been no witness of patient responding to auditory/visual hallucinations. Nursing reporting patient taking to himself could be just that cas patient talking to himself.  On assessment yesterday patient reported he liked to talk; but nobody to talk to "They just sit and look at you through the window.  Patient answered most question appropriately.   Psychiatric Consult 06/06/2021 HPI:  KRITHIK MAPEL, 75 y.o., male patient seen via tele health by this provider, consulted with Dr. Nelly Rout; and chart reviewed on 06/06/2021  On evaluation [name] reports I'm doing fine.  I had a good breakfast now I'm ready for lunch."  Patient asked if he knew why he was brought to the hospital and he stated "Yeah I know.  You know I got a blood condition and I was taking vinegar instead of taking the pill.  You know vinegar will bring it down quick."  Discussed with patient that his blood pressure had been running high and that he would still need to take his medication even if he too teaspoon of vinegar everyday.  Discussed the importance of taking medications.  Patient then stats "They don't want me running around in the streets picking up trash and cans."  Patient states he was living with a lady named kelly "But she got mad.  She don't like you telling her what she do wrong.  She stay mad all the time."  When asked who didn't want him in the streets he stated "My son.  I call him triple A cause his name is Berley Gambrell."  Patient reporting he has been sleeping well and eating well. States he is taking his medication without adverse reaction.  Patient denies suicidal/self-harm/homicidal  ideation, psychosis, and paranoia.  When asked about homicidal ideation patient made the statement "The only person I would want to hurt is a man beating on a woman."  When assessing patient for orientation he was able to give correct answers to questions.  Patient also talked about "I came to [facility] when I was straight out of high school.  There was alligator eating people.  I said look a here alligator you cross my patient, you'll be skint and on the table."  Patient asked what he meant when discussing alligator eating people and he stated, "When I first moved here [facility]. was a swamp."  Patient reported he was born in [place].  Patient was able to carry an appropriate conversation and stating he enjoyed talking.  Patient reporting, he didn't mind staying in the hospital because "I don't have no  where else to go and get 3 meals a day.  Patient talked about his favorite show that comes on, on Monday nights at 9:00 PM.  During evaluation Army Meliaogers L Nagy is sitting up in bed in no acute distress.  He is alert, oriented x 4.  Patient was able to give the correct information for DOB, place, city, state, country, day or week, and month.  Patient was unable to give the names of the last 3 presidents stating he would have to "look that up."  When asked how long he had been in the hospital Patient stated "I'm not sure but I can tell ya.  You get 3 meals a day and every time you get a meal they give you one of these (referring to a receipts he had kept put away with is things.  He took out then counted).  I got 3 of these so that is at least one day."  Patient is  calm, cooperative and his  mood is pleasant and euthymic with congruent affect.  He does not appear to be responding to internal/external stimuli or delusional thoughts.  Patient denies suicidal/self-harm/homicidal ideation, psychosis, and paranoia.  Patient answered question appropriately.  Patient homeless and unsure if patient is able to care for  himself if living on the streets.  Patient would be more appropriate in assisted living.  Patient doesn't appear to be able to make the arrangements or contacts to get himself in an assisted living facility.  May be a discussion that social work/TOC can have with patients family.  Patient has been in ED/urgent care several time with similar presentation altered mental status. Patient unable to care for self in street an would benefit from assisted living.     According to medical record [facility] when brought in by his son with history of dementia newly diagnosed, at that time he was also rambling, incoherent, slurred speech.  Collateral from patients son is that patient has been homeless for 4 months.     Past Psychiatric History: Bipolar one disorder, aggressive behavior  Risk to Self:  Denies Risk to Others:  Denies Prior Inpatient Therapy: Yes Prior Outpatient Therapy:  Yes, denies at this time  Past Medical History:  Past Medical History:  Diagnosis Date   Hypertension    History reviewed. No pertinent surgical history. Family History: History reviewed. No pertinent family history. Family Psychiatric  History: Unaware Social History:  Social History   Substance and Sexual Activity  Alcohol Use No   Alcohol/week: 0.0 standard drinks     Social History   Substance and Sexual Activity  Drug Use No    Social History   Socioeconomic History   Marital status: Married    Spouse name: Not on file   Number of children: Not on file   Years of education: Not on file   Highest education level: Not on file  Occupational History   Not on file  Tobacco Use   Smoking status: Every Day    Packs/day: 0.50    Years: 55.00    Pack years: 27.50    Types: Cigarettes   Smokeless tobacco: Never  Substance and Sexual Activity   Alcohol use: No    Alcohol/week: 0.0 standard drinks   Drug use: No   Sexual activity: Never    Birth control/protection: Abstinence   Other Topics Concern   Not on file  Social History Narrative   Not on file   Social Determinants of  Health   Financial Resource Strain: Not on file  Food Insecurity: Not on file  Transportation Needs: Not on file  Physical Activity: Not on file  Stress: Not on file  Social Connections: Not on file   Additional Social History:    Allergies:  No Known Allergies  Labs: No results found for this or any previous visit (from the past 48 hour(s)).  Medications:  Current Facility-Administered Medications  Medication Dose Route Frequency Provider Last Rate Last Admin   amLODipine (NORVASC) tablet 5 mg  5 mg Oral Daily Melene Plan, DO   5 mg at 06/07/21 0916   hydrochlorothiazide (HYDRODIURIL) tablet 25 mg  25 mg Oral Daily Melene Plan, DO   25 mg at 06/07/21 0916   QUEtiapine (SEROQUEL) tablet 25 mg  25 mg Oral BID Ardis Hughs, NP   25 mg at 06/07/21 1610   Current Outpatient Medications  Medication Sig Dispense Refill   cephALEXin (KEFLEX) 500 MG capsule Take 1 capsule (500 mg total) by mouth 4 (four) times daily. (Patient not taking: No sig reported) 20 capsule 0   hydrochlorothiazide (HYDRODIURIL) 25 MG tablet Take 1 tablet (25 mg total) by mouth daily. (Patient not taking: Reported on 06/01/2021) 90 tablet 3   HYDROcodone-acetaminophen (NORCO/VICODIN) 5-325 MG tablet Take 1 tablet by mouth every 6 (six) hours as needed for moderate pain. (Patient not taking: No sig reported) 10 tablet 0    Musculoskeletal: Strength & Muscle Tone: within normal limits Gait & Station: normal Patient leans: N/A  Psychiatric Specialty Exam:  Presentation  General Appearance: Appropriate for Environment  Eye Contact:Good  Speech:Clear and Coherent; Normal Rate  Speech Volume:Normal  Handedness:Right   Mood and Affect  Mood:Euthymic  Affect:Appropriate; Congruent   Thought Process  Thought Processes:Coherent; Goal Directed  Descriptions of  Associations:Tangential  Orientation:Full (Time, Place and Person)  Thought Content:WDL  History of Schizophrenia/Schizoaffective disorder:No  Duration of Psychotic Symptoms:Greater than six months  Hallucinations:Hallucinations: None Ideas of Reference:None  Suicidal Thoughts:Suicidal Thoughts: No Homicidal Thoughts:Homicidal Thoughts: No  Sensorium  Memory:Immediate Fair  Judgment:Fair  Insight:Fair   Executive Functions  Concentration:Fair  Attention Span:Fair  Recall:Fair  Fund of Knowledge:Fair  Language:Fair   Psychomotor Activity  Psychomotor Activity: Psychomotor Activity: Normal  Assets  Assets:Communication Skills; Desire for Improvement; Resilience   Sleep  Sleep: Sleep: Good   Physical Exam: Physical Exam Vitals and nursing note reviewed. Exam conducted with a chaperone present.  Constitutional:      General: He is not in acute distress.    Appearance: Normal appearance. He is not ill-appearing.  Cardiovascular:     Rate and Rhythm: Normal rate.     Comments: Elevated blood pressure Pulmonary:     Effort: Pulmonary effort is normal.  Neurological:     Mental Status: He is alert and oriented to person, place, and time.  Psychiatric:        Attention and Perception: Attention and perception normal. He does not perceive auditory or visual hallucinations.        Mood and Affect: Mood and affect normal.        Speech: Speech normal.        Behavior: Behavior normal. Behavior is cooperative.        Thought Content: Thought content normal. Thought content is not paranoid or delusional. Thought content does not include homicidal or suicidal ideation.        Cognition and Memory: Cognition normal.        Judgment: Judgment is impulsive.  Review of Systems  Constitutional: Negative.   HENT: Negative.    Eyes: Negative.   Respiratory: Negative.    Cardiovascular: Negative.  Negative for chest pain and palpitations.  Gastrointestinal:  Negative.   Genitourinary: Negative.   Musculoskeletal: Negative.   Skin: Negative.   Neurological: Negative.   Endo/Heme/Allergies: Negative.   Psychiatric/Behavioral:  Depression: Stable. Hallucinations: Denies. Memory loss: Reporting some problems with remebering things sometimes. Substance abuse: Denies at this time; but states he did smoke a blunt couple of days ago.  Also reported that he done drugs when he was younger but not now. Suicidal ideas: Denies suicidal ideation; and states he has never tried to kill himself. The patient does not have insomnia. Nervous/anxious: Stable.       Patient is not having any delusional thinking when talking about alligators and boogie bears.   Old Wives' Tales about alligators in the in the sewer.  Patient laughing when talking about the boogie bear better get on the bus.      Blood pressure 114/87, pulse 83, temperature 97.6 F (36.4 C), temperature source Oral, resp. rate 16, height 5\' 11"  (1.803 m), weight 71 kg, SpO2 99 %. Body mass index is 21.83 kg/m.  Treatment Plan Summary: Plan Psychiatrically clear. Social work/TOC to assist/instruct patient and family on course/steps for placing patient in assisted living or facility.  Patient unable to do himself.   Patient continues to be psychiatrically cleared.  Social work aware of disposition.  Assessment finding continues to be that patient is unable to care for himself and should not be discharged to the street without place to go and no one to assist.  Outpatient psychiatric resources/referral also continues to be needed for follow up medication management; and a prescription for Seroquel   Disposition:  Psychiatrically cleared No evidence of imminent risk to self or others at present.   Patient does not meet criteria for psychiatric inpatient admission. Supportive therapy provided about ongoing stressors. Discussed crisis plan, support from social network, calling 911, coming to the Emergency  Department, and calling Suicide Hotline. Outpatient psychiatric services for medication management and Family Care Home or Assisted living placement  This service was provided via telemedicine using a 2-way, interactive audio and video technology.  Names of all persons participating in this telemedicine service and their role in this encounter. Name: , NP Role: PMHNP  Name: Dr. Assunta Found Role: Psychiatrist  Name: Nelly Rout Role: Patient  Name: Nelida Gores, RN Role: Patient's nurse sent a secure message informing: Psychiatric reassessment complete and patient remains psychiatrically cleared; and should not be discharged to the street; needing social work/case management to assist with placement or resources.  Patient will also need prescription for Seroquel and services for outpatient psychiatric services.      Maziyah Vessel, NP 06/07/2021 11:11 AM

## 2021-06-07 NOTE — Progress Notes (Signed)
CSW was told by APS that Levora Angel was assigned to the case.

## 2021-06-07 NOTE — Progress Notes (Signed)
Message left with APS to verify who is assigned to patients case.

## 2021-06-07 NOTE — ED Provider Notes (Signed)
Patient was initially going to geriatric psych.  He was recently psychiatric cleared and it was suggested he should go to assisted living.  Patient has become delusional now having hallucinations.  We will get another behavioral health consult and see if they can arrange again to try to get him into geriatric psych   Bethann Berkshire, MD 06/07/21 [phone]

## 2021-06-07 NOTE — ED Notes (Signed)
Needs housing. Medical and Psych cleared.

## 2021-06-07 NOTE — Progress Notes (Signed)
CSW contacted patients son, Todd Neal and he stated that he is in [place] and will not be back until Friday. CSW told patient that his father is up for discharge and was checking to see if someone could pick-up his father. Todd Neal stated he can see if his cousin or sister can do it. Todd Neal stated that since he is the POA can he request patient not be discharged. CSW has not seen any paperwork to verify this information. CSW explained his father is psych and medically cleared so there is nothing to keep him in the hospital. Patients son stated how is this possible when he needs 24 hour care. CSW reminded Todd Neal about their conversation last month in June 2022 and she told him and his brother to apply for Medicaid for his father to seek placement. Todd Neal stated he remembers that conversation and that he didn;t realize his father didn't have his social security card. Todd Neal stated its so much stuff and that he still has to work as well. CSW stated she understands but if psychiatry is not recommending an inpatient facility and due to him not having a payor source for placement then he will not be able to stay in the ED. Todd Neal asked CSW how much a month is a facility. CSW stated assisted living is between 5,000-10,000 a month out of pocket. Todd Neal stated that he needed to call CSW back because he is at work but will have his cousin or sister call CSW.

## 2021-06-07 NOTE — Progress Notes (Signed)
CSW received a call from patients niece Wynelle Beckmann [phone]) who lives in [place]. Ms. Laural Benes was upset that patient is being discharged and stated that patient needs to go to [place]. Ms. Laural Benes stated she is in this field and has a group home. CSW explained patient is medically and psych cleared and cannot stay in the ED. Ms. Laural Benes stated patient has money and she is not sure where the money is. CSW informed Ms. Laural Benes that CSW has given his sons the information to apply for medicaid last month. Ms. Laural Benes stated she does not feel his sons are capable of caring for their father. Ms. Laural Benes asked what she could do. CSW stated that someone really needs to apply for Medicaid for patient to see if he qualifies to even get LTC placement. CSW also stated if her family is interested in guardianship she could seek advise from the clerk of court. Ms. Laural Benes stated that patients son has guardianship over their father. CSW stated she has no record of that and that has not been provided to the hospital. Ms. Laural Benes stated she would speak with the son and find out what's going on with that. Ms. Laural Benes stated she would call patients other son to see if he can pick up his father.     *The son stated he has POA, but has not provided that information to the hospital. The son has never stated he has legal guardianship. No paperwork has been provided with proof.

## 2021-06-07 NOTE — ED Notes (Signed)
Pt is talking to self.

## 2021-06-07 NOTE — Progress Notes (Signed)
APS completed interview with patient. Patient stated the following during his assessment. Psychiatry was present during the interview.    -Patient stated he is not homeless and has a home that's a restaurant  -Patient stated he doesn't live in his home because he needs to clean up the streets -Patient stated it should be Monday or Tuesday and its a small number maybe the 9th or the 10th  -Patient could state its 2022.  -Patient stated it should be June or July  -Patient knows his birthday and name  -Patient stated he was living with his son and his girlfriend  -Patient stated he is about 75 years old  -Patient stated he can catch himself a cab if he is discharged from the hospital  -Patient stated the alligator's are still   Patient mentioned the following during his evaluation with Psych   -He stated he was living at the jailhouse  -He's not homeless -The bear is bear and needs to be on a school bus  -We he was told that he should not go back to the streets he stated its up to the police.    CSW was told by psychiatry after the evaluation that patient will remain psych cleared and will not be recommended for geri psych. Psychiatry stated they do not want patient to go to the streets. CSW stated patient has no payer source for a facility due to not having medicaid. Family also is not able to do private pay. CSW asked APS the likelihood of filing legal guardianship for patient. APS stated probably not because patient has been psych cleared and they will need documentation stating he is incompetent. Psychiatry also stated they are not allowed to determine capacity.

## 2021-06-07 NOTE — ED Notes (Signed)
[facility] IVC paperwork sent to the [employer], copy place in paper chart and red envelope.

## 2021-06-07 NOTE — Progress Notes (Signed)
CSW reached out to Todd Neal with [employer] to find out if patient qualifies for Medicaid. CSW is not sure if patients income of $1905 a month is too much. CSW also is not sure if patient has any other assets.

## 2021-06-07 NOTE — ED Notes (Addendum)
Todd Rankin, NP note Psychiatric reassessment complete and patient remains psychiatrically cleared; and should not be discharged to the street; needing social work/case management to assist with placement or resources.  Patient will also need prescription for Seroquel and services for outpatient psychiatric services.

## 2021-06-07 NOTE — ED Notes (Signed)
Patient showered, given clean clothes, and something to eat. He is very thankful.

## 2021-06-07 NOTE — Progress Notes (Signed)
CSW spoke with Wandra Mannan, [facility] Director to update him on patients situation with not having medicaid and being psych cleared. CSW also informed TOC director that psych could not determine capacity. CSW was instructed to reach out to ED provider who can determine if patient should be discharged.

## 2021-06-07 NOTE — ED Notes (Signed)
Discharge paperwork given and reviewed. He is going to pick up his Seroquel from the pharmacy then head over to the shelter. Bus passes given per social work.

## 2021-06-08 NOTE — Progress Notes (Signed)
CSW received a call back from [facility] about patient obtaining Medicaid. CSW provided the niece and sons number. [facility] financial will be reaching out to the family.

## 2021-08-19 NOTE — Progress Notes (Signed)
Subjective:    Todd Neal - 75 y.o. male MRN [id number]  Date of birth: 10/27/1946  HPI  Todd Neal is to establish care. He presents to appointment unaccompanied.   Current issues and/or concerns: HYPERTENSION: Reports started taking blood pressure medication last week. Doing well on current regimen. No side effects. No issues/concerns. Denies chest pain and shortness of breath.    ROS per HPI   Health Maintenance:  Health Maintenance Due  Topic Date Due   COVID-19 Vaccine (1) Never done   COLONOSCOPY (Pts 45-80yrs Insurance coverage will need to be confirmed)  Never done   Zoster Vaccines- Shingrix (1 of 2) Never done   INFLUENZA VACCINE  Never done    Past Medical History: Patient Active Problem List   Diagnosis Date Noted   Essential hypertension 08/01/2017   Colon cancer screening 08/01/2017   Bipolar I disorder (HCC) 02/18/2015    Social History   reports that he has been smoking cigarettes. He has a 27.50 pack-year smoking history. He has never used smokeless tobacco. He reports that he does not drink alcohol and does not use drugs.   Family History  family history is not on file.   Medications: reviewed and updated   Objective:   Physical Exam BP (!) 146/86   Pulse 83   Temp 98.3 F (36.8 C)   Resp 18   Ht 5' 10.98" (1.803 m)   Wt 157 lb (71.2 kg)   SpO2 96%   BMI 21.91 kg/m   Physical Exam HENT:     Head: Normocephalic and atraumatic.  Eyes:     Extraocular Movements: Extraocular movements intact.     Conjunctiva/sclera: Conjunctivae normal.     Pupils: Pupils are equal, round, and reactive to light.  Cardiovascular:     Rate and Rhythm: Normal rate and regular rhythm.     Pulses: Normal pulses.     Heart sounds: Normal heart sounds.  Pulmonary:     Effort: Pulmonary effort is normal.     Breath sounds: Normal breath sounds.  Musculoskeletal:     Cervical back: Normal range of motion and neck supple.  Neurological:     General:  No focal deficit present.     Mental Status: He is alert and oriented to person, place, and time.  Psychiatric:        Mood and Affect: Mood normal.        Behavior: Behavior normal.        Assessment & Plan:  1. Encounter to establish care: - Patient presents today to establish care.  - Return for annual physical examination, labs, and health maintenance. Arrive fasting meaning having no food for at least 8 hours prior to appointment. You may have only water or black coffee. Please take scheduled medications as normal.  2. Essential hypertension: - Continue Hydrochlorothiazide as prescribed.  - Counseled on blood pressure goal of less than 140/90, low-sodium, DASH diet, medication compliance, 150 minutes of moderate intensity exercise per week as tolerated. Discussed medication compliance, adverse effects. - Follow-up with primary provider in 3 months or sooner if needed.  - hydrochlorothiazide (HYDRODIURIL) 25 MG tablet; Take 1 tablet (25 mg total) by mouth daily.  Dispense: 90 tablet; Refill: 0     Patient was given clear instructions to go to Emergency Department or return to medical center if symptoms don't improve, worsen, or new problems develop.The patient verbalized understanding.  I discussed the assessment and treatment plan with the patient. The  patient was provided an opportunity to ask questions and all were answered. The patient agreed with the plan and demonstrated an understanding of the instructions.   The patient was advised to call back or seek an in-person evaluation if the symptoms worsen or if the condition fails to improve as anticipated.    Ricky Stabs, NP 08/23/2021, 3:08 PM [facility] at [facility]

## 2021-08-23 ENCOUNTER — Other Ambulatory Visit: Payer: Self-pay

## 2021-08-23 ENCOUNTER — Ambulatory Visit (INDEPENDENT_AMBULATORY_CARE_PROVIDER_SITE_OTHER): Payer: Medicare HMO | Admitting: Family

## 2021-08-23 ENCOUNTER — Encounter: Payer: Self-pay | Admitting: Family

## 2021-08-23 VITALS — BP 146/86 | HR 83 | Temp 98.3°F | Resp 18 | Ht 70.98 in | Wt 157.0 lb

## 2021-08-23 DIAGNOSIS — I10 Essential (primary) hypertension: Secondary | ICD-10-CM | POA: Diagnosis not present

## 2021-08-23 DIAGNOSIS — Z7689 Persons encountering health services in other specified circumstances: Secondary | ICD-10-CM | POA: Diagnosis not present

## 2021-08-23 MED ORDER — HYDROCHLOROTHIAZIDE 25 MG PO TABS: 25.0000 mg | ORAL_TABLET | Freq: Every day | ORAL | 0 refills | Status: DC

## 2021-08-23 NOTE — Progress Notes (Signed)
Pt presents to establish care, needs refill on HCTZ, been out of meds for about week

## 2021-08-23 NOTE — Patient Instructions (Signed)
Thank you for choosing Primary Care at [facility] for your medical home!    Todd Neal was seen by Rema Fendt, NP today.   Tyson Dense Teal's primary care provider is Ricky Stabs, NP.   For the best care possible,  you should try to see Ricky Stabs, NP whenever you come to clinic.   We look forward to seeing you again soon!  If you have any questions about your visit today,  please call us at [phone]  Or feel free to reach your provider via MyChart.    Keeping you healthy   Get these tests Blood pressure- Have your blood pressure checked once a year by your healthcare provider.  Normal blood pressure is 120/80. Weight- Have your body mass index (BMI) calculated to screen for obesity.  BMI is a measure of body fat based on height and weight. You can also calculate your own BMI at https://www.west-esparza.com/. Cholesterol- Have your cholesterol checked regularly starting at age 58, sooner may be necessary if you have diabetes, high blood pressure, if a family member developed heart diseases at an early age or if you smoke.  Chlamydia, HIV, and other sexual transmitted disease- Get screened each year until the age of 6 then within three months of each new sexual partner. Diabetes- Have your blood sugar checked regularly if you have high blood pressure, high cholesterol, a family history of diabetes or if you are overweight.   Get these vaccines Flu shot- Every fall. Tetanus shot- Every 10 years. Menactra- Single dose; prevents meningitis.   Take these steps Don't smoke- If you do smoke, ask your healthcare provider about quitting. For tips on how to quit, go to www.smokefree.gov or call 1-800-QUIT-NOW. Be physically active- Exercise 5 days a week for at least 30 minutes.  If you are not already physically active start slow and gradually work up to 30 minutes of moderate physical activity.  Examples of moderate activity include walking briskly, mowing the yard, dancing,  swimming bicycling, etc. Eat a healthy diet- Eat a variety of healthy foods such as fruits, vegetables, low fat milk, low fat cheese, yogurt, lean meats, poultry, fish, beans, tofu, etc.  For more information on healthy eating, go to www.thenutritionsource.org Drink alcohol in moderation- Limit alcohol intake two drinks or less a day.  Never drink and drive. Dentist- Brush and floss teeth twice daily; visit your dentis twice a year. Depression-Your emotional health is as important as your physical health.  If you're feeling down, losing interest in things you normally enjoy please talk with your healthcare provider. Gun Safety- If you keep a gun in your home, keep it unloaded and with the safety lock on.  Bullets should be stored separately. Helmet use- Always wear a helmet when riding a motorcycle, bicycle, rollerblading or skateboarding. Safe sex- If you may be exposed to a sexually transmitted infection, use a condom Seat belts- Seat bels can save your life; always wear one. Smoke/Carbon Monoxide detectors- These detectors need to be installed on the appropriate level of your home.  Replace batteries at least once a year. Skin Cancer- When out in the sun, cover up and use sunscreen SPF 15 or higher. Violence- If anyone is threatening or hurting you, please tell your healthcare provider.

## 2022-04-06 ENCOUNTER — Emergency Department (HOSPITAL_COMMUNITY)
Admission: EM | Admit: 2022-04-06 | Discharge: 2022-04-07 | Disposition: A | Discharge status: Home / Self Care | Payer: Medicare HMO | Attending: Emergency Medicine | Admitting: Emergency Medicine

## 2022-04-06 ENCOUNTER — Encounter (HOSPITAL_COMMUNITY): Payer: Self-pay

## 2022-04-06 ENCOUNTER — Other Ambulatory Visit: Payer: Self-pay

## 2022-04-06 DIAGNOSIS — Z79899 Other long term (current) drug therapy: Secondary | ICD-10-CM | POA: Diagnosis not present

## 2022-04-06 DIAGNOSIS — Z760 Encounter for issue of repeat prescription: Secondary | ICD-10-CM | POA: Diagnosis not present

## 2022-04-06 DIAGNOSIS — I10 Essential (primary) hypertension: Secondary | ICD-10-CM | POA: Diagnosis not present

## 2022-04-06 DIAGNOSIS — R600 Localized edema: Secondary | ICD-10-CM | POA: Insufficient documentation

## 2022-04-06 NOTE — ED Provider Triage Note (Signed)
Emergency Medicine Provider Triage Evaluation Note  Todd Neal , a 76 y.o. male  was evaluated in triage.  Pt complains of bilateral feet swelling.  States this is a chronic issue.  Has been in jail for the last 8 months.  Has not been able to take his blood pressure medication.  Appears groggy and is overall a poor historian.  Rambles about unrelated topics when asked questions.  Hx of bipolar disorder, HTN, IVC, and tobacco use.  Review of Systems  Positive:  Negative: As above  Physical Exam  BP (!) 153/69   Pulse 79   Temp 97.7 F (36.5 C)   Resp 17   Ht 5\' 11"  (1.803 m)   Wt 74.8 kg   SpO2 98%   BMI 23.01 kg/m  Gen:   Awake, groggy appearing, toxic appearing Resp:  Normal effort  MSK:   Moves extremities without difficulty  Other:  Able to wiggle toes, feet, and legs.  Able to feel sensation of lower extremities.  Overall poor cooperation.  Medical Decision Making  Medically screening exam initiated at 10:11 PM.  Appropriate orders placed.  was informed that the remainder of the evaluation will be completed by another provider, this initial triage assessment does not replace that evaluation, and the importance of remaining in the ED until their evaluation is complete.     [name], PA-C 04/06/22 2221

## 2022-04-06 NOTE — ED Triage Notes (Signed)
Pt reports with bilateral foot swelling and pain that started a while ago.

## 2022-04-07 MED ORDER — HYDROCHLOROTHIAZIDE 25 MG PO TABS: 25.0000 mg | ORAL_TABLET | Freq: Every day | ORAL | 0 refills | Status: DC

## 2022-04-07 NOTE — ED Notes (Signed)
Pt states understanding of dc instructions, importance of follow up. Pt denies questions or concerns and declined transportation assistance upon dc. Pt ambulated w/ a steady gait w/o need for assistance. No belongings left in room upon dc. ? ?

## 2022-04-07 NOTE — ED Provider Notes (Signed)
[facility]-EMERGENCY DEPT Provider Note   CSN: [id number] Arrival date & time: 04/06/22  2147     History  Chief Complaint  Patient presents with   Foot Pain   Foot Swelling    Todd Neal is a 76 y.o. male.  Patient is a 76 year old male with history of bipolar disorder, hypertension.  Patient presenting today with complaints of being out of his blood pressure medicine.  He has been off this for some time and would like to restart it.  He denies to me that he is having any other symptoms.  Per triage note, he is complaining of bilateral foot swelling, however he denies that to me.  He denies he is having any chest pain or difficulty breathing.  The history is provided by the patient.      Home Medications Prior to Admission medications   Medication Sig Start Date End Date Taking? Authorizing Provider  hydrochlorothiazide (HYDRODIURIL) 25 MG tablet Take 1 tablet (25 mg total) by mouth daily. 08/23/21   Rema Fendt, NP  QUEtiapine (SEROQUEL) 25 MG tablet Take 1 tablet (25 mg total) by mouth 2 (two) times daily. 06/07/21   Bethann Berkshire, MD      Allergies    Patient has no known allergies.    Review of Systems   Review of Systems  All other systems reviewed and are negative.  Physical Exam Updated Vital Signs BP (!) 195/86 (BP Location: Right Arm)   Pulse 82   Temp 97.7 F (36.5 C)   Resp 16   Ht 5\' 11"  (1.803 m)   Wt 74.8 kg   SpO2 99%   BMI 23.01 kg/m  Physical Exam Vitals and nursing note reviewed.  Constitutional:      General: He is not in acute distress.    Appearance: He is well-developed. He is not diaphoretic.  HENT:     Head: Normocephalic and atraumatic.  Cardiovascular:     Rate and Rhythm: Normal rate and regular rhythm.     Heart sounds: No murmur heard.   No friction rub.  Pulmonary:     Effort: Pulmonary effort is normal. No respiratory distress.     Breath sounds: Normal breath sounds. No wheezing or rales.   Abdominal:     General: Bowel sounds are normal. There is no distension.     Palpations: Abdomen is soft.     Tenderness: There is no abdominal tenderness.  Musculoskeletal:        General: Normal range of motion.     Cervical back: Normal range of motion and neck supple.     Right lower leg: Edema present.     Left lower leg: Edema present.     Comments: There is chronic appearing edema of both lower extremities.  Skin:    General: Skin is warm and dry.  Neurological:     Mental Status: He is alert and oriented to person, place, and time.     Coordination: Coordination normal.    ED Results / Procedures / Treatments   Labs (all labs ordered are listed, but only abnormal results are displayed) Labs Reviewed - No data to display  EKG None  Radiology No results found.  Procedures Procedures    Medications Ordered in ED Medications - No data to display  ED Course/ Medical Decision Making/ A&P  Patient presenting today requesting a refill of his blood pressure medication.  He has no other complaints.  I will represcribe his  hydrochlorothiazide and have him follow-up with his primary doctor.  Final Clinical Impression(s) / ED Diagnoses Final diagnoses:  None    Rx / DC Orders ED Discharge Orders     None         Geoffery Lyons, MD 04/07/22 0410

## 2022-04-07 NOTE — Discharge Instructions (Addendum)
Begin taking hydrochlorothiazide as previously prescribed.  This medication was refilled in the ER for you this evening.  Follow-up with primary doctor for future refills.

## 2022-05-21 ENCOUNTER — Emergency Department (HOSPITAL_COMMUNITY): Payer: Medicare HMO

## 2022-05-21 ENCOUNTER — Other Ambulatory Visit: Payer: Self-pay

## 2022-05-21 ENCOUNTER — Inpatient Hospital Stay (HOSPITAL_COMMUNITY)
Admission: EM | Admit: 2022-05-21 | Discharge: 2022-05-23 | DRG: 086 | Disposition: A | Discharge status: Home Health | Payer: Medicare HMO | Attending: Family Medicine | Admitting: Family Medicine

## 2022-05-21 ENCOUNTER — Encounter (HOSPITAL_COMMUNITY): Payer: Self-pay

## 2022-05-21 DIAGNOSIS — F1410 Cocaine abuse, uncomplicated: Secondary | ICD-10-CM | POA: Diagnosis present

## 2022-05-21 DIAGNOSIS — S0990XA Unspecified injury of head, initial encounter: Secondary | ICD-10-CM | POA: Diagnosis not present

## 2022-05-21 DIAGNOSIS — I160 Hypertensive urgency: Secondary | ICD-10-CM | POA: Diagnosis present

## 2022-05-21 DIAGNOSIS — R9431 Abnormal electrocardiogram [ECG] [EKG]: Secondary | ICD-10-CM | POA: Diagnosis not present

## 2022-05-21 DIAGNOSIS — S060X0A Concussion without loss of consciousness, initial encounter: Secondary | ICD-10-CM | POA: Diagnosis not present

## 2022-05-21 DIAGNOSIS — R58 Hemorrhage, not elsewhere classified: Secondary | ICD-10-CM | POA: Diagnosis not present

## 2022-05-21 DIAGNOSIS — G9340 Encephalopathy, unspecified: Secondary | ICD-10-CM | POA: Diagnosis not present

## 2022-05-21 DIAGNOSIS — Y92521 Bus station as the place of occurrence of the external cause: Secondary | ICD-10-CM | POA: Diagnosis not present

## 2022-05-21 DIAGNOSIS — F319 Bipolar disorder, unspecified: Secondary | ICD-10-CM | POA: Diagnosis present

## 2022-05-21 DIAGNOSIS — S065XAA Traumatic subdural hemorrhage with loss of consciousness status unknown, initial encounter: Secondary | ICD-10-CM | POA: Diagnosis not present

## 2022-05-21 DIAGNOSIS — S0993XA Unspecified injury of face, initial encounter: Secondary | ICD-10-CM | POA: Diagnosis not present

## 2022-05-21 DIAGNOSIS — R4182 Altered mental status, unspecified: Secondary | ICD-10-CM | POA: Diagnosis not present

## 2022-05-21 DIAGNOSIS — S0181XA Laceration without foreign body of other part of head, initial encounter: Secondary | ICD-10-CM | POA: Diagnosis not present

## 2022-05-21 DIAGNOSIS — S0003XA Contusion of scalp, initial encounter: Secondary | ICD-10-CM | POA: Diagnosis not present

## 2022-05-21 DIAGNOSIS — S022XXA Fracture of nasal bones, initial encounter for closed fracture: Secondary | ICD-10-CM | POA: Diagnosis not present

## 2022-05-21 DIAGNOSIS — Z5900 Homelessness unspecified: Secondary | ICD-10-CM

## 2022-05-21 DIAGNOSIS — R220 Localized swelling, mass and lump, head: Secondary | ICD-10-CM | POA: Diagnosis not present

## 2022-05-21 DIAGNOSIS — I10 Essential (primary) hypertension: Secondary | ICD-10-CM | POA: Diagnosis not present

## 2022-05-21 DIAGNOSIS — Z87891 Personal history of nicotine dependence: Secondary | ICD-10-CM

## 2022-05-21 DIAGNOSIS — I674 Hypertensive encephalopathy: Secondary | ICD-10-CM | POA: Diagnosis not present

## 2022-05-21 DIAGNOSIS — Z79899 Other long term (current) drug therapy: Secondary | ICD-10-CM

## 2022-05-21 DIAGNOSIS — R400 Somnolence: Secondary | ICD-10-CM | POA: Diagnosis not present

## 2022-05-21 DIAGNOSIS — Z743 Need for continuous supervision: Secondary | ICD-10-CM | POA: Diagnosis not present

## 2022-05-21 DIAGNOSIS — I6200 Nontraumatic subdural hemorrhage, unspecified: Secondary | ICD-10-CM

## 2022-05-21 DIAGNOSIS — S065X0A Traumatic subdural hemorrhage without loss of consciousness, initial encounter: Secondary | ICD-10-CM | POA: Diagnosis not present

## 2022-05-21 DIAGNOSIS — W03XXXA Other fall on same level due to collision with another person, initial encounter: Secondary | ICD-10-CM | POA: Diagnosis not present

## 2022-05-21 DIAGNOSIS — S199XXA Unspecified injury of neck, initial encounter: Secondary | ICD-10-CM | POA: Diagnosis not present

## 2022-05-21 DIAGNOSIS — R69 Illness, unspecified: Secondary | ICD-10-CM | POA: Diagnosis not present

## 2022-05-21 LAB — COMPREHENSIVE METABOLIC PANEL
ALT: 16 U/L (ref 0–44)
AST: 18 U/L (ref 15–41)
Albumin: 4.3 g/dL (ref 3.5–5.0)
Alkaline Phosphatase: 82 U/L (ref 38–126)
Anion gap: 7 (ref 5–15)
BUN: 14 mg/dL (ref 8–23)
CO2: 27 mmol/L (ref 22–32)
Calcium: 9.9 mg/dL (ref 8.9–10.3)
Chloride: 108 mmol/L (ref 98–111)
Creatinine, Ser: 0.86 mg/dL (ref 0.61–1.24)
GFR, Estimated: >60 mL/min (ref >60)
Glucose, Bld: 106 mg/dL — ABNORMAL HIGH (ref 70–99)
Potassium: 3.6 mmol/L (ref 3.5–5.1)
Sodium: 142 mmol/L (ref 135–145)
Total Bilirubin: 0.7 mg/dL (ref 0.3–1.2)
Total Protein: 7.5 g/dL (ref 6.5–8.1)

## 2022-05-21 LAB — BLOOD GAS, VENOUS
Acid-Base Excess: 7.4 mmol/L — ABNORMAL HIGH (ref 0.0–2.0)
Bicarbonate: 33.2 mmol/L — ABNORMAL HIGH (ref 20.0–28.0)
O2 Saturation: 53.6 %
Patient temperature: 37
pCO2, Ven: 50 mmHg (ref 44–60)
pH, Ven: 7.43 (ref 7.25–7.43)
pO2, Ven: <31 mmHg — CL (ref 32–45)

## 2022-05-21 LAB — RAPID URINE DRUG SCREEN, HOSP PERFORMED
Amphetamines: NOT DETECTED
Barbiturates: NOT DETECTED
Benzodiazepines: NOT DETECTED
Cocaine: POSITIVE — AB
Opiates: NOT DETECTED
Tetrahydrocannabinol: NOT DETECTED

## 2022-05-21 LAB — CBC WITH DIFFERENTIAL/PLATELET
Abs Immature Granulocytes: 0.03 10*3/uL (ref 0.00–0.07)
Basophils Absolute: 0 10*3/uL (ref 0.0–0.1)
Basophils Relative: 0 %
Eosinophils Absolute: 0.2 10*3/uL (ref 0.0–0.5)
Eosinophils Relative: 2 %
HCT: 44.7 % (ref 39.0–52.0)
Hemoglobin: 14.2 g/dL (ref 13.0–17.0)
Immature Granulocytes: 0 %
Lymphocytes Relative: 17 %
Lymphs Abs: 1.5 10*3/uL (ref 0.7–4.0)
MCH: 30.9 pg (ref 26.0–34.0)
MCHC: 31.8 g/dL (ref 30.0–36.0)
MCV: 97.2 fL (ref 80.0–100.0)
Monocytes Absolute: 0.5 10*3/uL (ref 0.1–1.0)
Monocytes Relative: 6 %
Neutro Abs: 6.5 10*3/uL (ref 1.7–7.7)
Neutrophils Relative %: 75 %
Platelets: 350 10*3/uL (ref 150–400)
RBC: 4.6 MIL/uL (ref 4.22–5.81)
RDW: 13.2 % (ref 11.5–15.5)
WBC: 8.7 10*3/uL (ref 4.0–10.5)
nRBC: 0 % (ref 0.0–0.2)

## 2022-05-21 LAB — I-STAT CHEM 8, ED
BUN: 12 mg/dL (ref 8–23)
Calcium, Ion: 1.18 mmol/L (ref 1.15–1.40)
Chloride: 105 mmol/L (ref 98–111)
Creatinine, Ser: 0.8 mg/dL (ref 0.61–1.24)
Glucose, Bld: 99 mg/dL (ref 70–99)
HCT: 42 % (ref 39.0–52.0)
Hemoglobin: 14.3 g/dL (ref 13.0–17.0)
Potassium: 3.8 mmol/L (ref 3.5–5.1)
Sodium: 142 mmol/L (ref 135–145)
TCO2: 26 mmol/L (ref 22–32)

## 2022-05-21 LAB — URINALYSIS, ROUTINE W REFLEX MICROSCOPIC
Bilirubin Urine: NEGATIVE
Glucose, UA: NEGATIVE mg/dL
Hgb urine dipstick: NEGATIVE
Ketones, ur: NEGATIVE mg/dL
Leukocytes,Ua: NEGATIVE
Nitrite: NEGATIVE
Protein, ur: NEGATIVE mg/dL
Specific Gravity, Urine: 1.01 (ref 1.005–1.030)
pH: 7 (ref 5.0–8.0)

## 2022-05-21 LAB — AMMONIA: Ammonia: 34 umol/L (ref 9–35)

## 2022-05-21 LAB — TSH: TSH: 0.378 u[IU]/mL (ref 0.350–4.500)

## 2022-05-21 LAB — ETHANOL: Alcohol, Ethyl (B): <10 mg/dL (ref <10)

## 2022-05-21 MED ORDER — HYDRALAZINE HCL 20 MG/ML IJ SOLN
10.0000 mg | Freq: Once | INTRAMUSCULAR | Status: AC
  Administered 2022-05-21: 10 mg via INTRAVENOUS
  Filled 2022-05-21: qty 1

## 2022-05-21 MED ORDER — ACETAMINOPHEN 650 MG RE SUPP: 650.0000 mg | Freq: Four times a day (QID) | RECTAL | Status: DC | PRN

## 2022-05-21 MED ORDER — HYDROCHLOROTHIAZIDE 12.5 MG PO TABS
25.0000 mg | ORAL_TABLET | Freq: Every day | ORAL | Status: DC
  Administered 2022-05-22 – 2022-05-23 (×2): 25 mg via ORAL
  Filled 2022-05-21 (×2): qty 2

## 2022-05-21 MED ORDER — HYDRALAZINE HCL 20 MG/ML IJ SOLN
10.0000 mg | INTRAMUSCULAR | Status: DC | PRN
  Administered 2022-05-21: 10 mg via INTRAVENOUS
  Filled 2022-05-21: qty 1

## 2022-05-21 MED ORDER — HYDRALAZINE HCL 20 MG/ML IJ SOLN
10.0000 mg | Freq: Once | INTRAMUSCULAR | Status: AC
Start: 2022-05-21 — End: 2022-05-21
  Administered 2022-05-21: 10 mg via INTRAVENOUS
  Filled 2022-05-21: qty 1

## 2022-05-21 MED ORDER — AMLODIPINE BESYLATE 5 MG PO TABS
10.0000 mg | ORAL_TABLET | Freq: Every day | ORAL | Status: DC
Start: 2022-05-21 — End: 2022-05-23
  Administered 2022-05-22 – 2022-05-23 (×2): 10 mg via ORAL
  Filled 2022-05-21 (×2): qty 2

## 2022-05-21 MED ORDER — ACETAMINOPHEN 325 MG PO TABS: 650.0000 mg | ORAL_TABLET | Freq: Four times a day (QID) | ORAL | Status: DC | PRN
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
## 2022-05-22 ENCOUNTER — Inpatient Hospital Stay (HOSPITAL_COMMUNITY): Payer: Medicare HMO

## 2022-05-22 DIAGNOSIS — I160 Hypertensive urgency: Secondary | ICD-10-CM | POA: Diagnosis not present

## 2022-05-22 DIAGNOSIS — I10 Essential (primary) hypertension: Secondary | ICD-10-CM | POA: Diagnosis not present

## 2022-05-22 DIAGNOSIS — F319 Bipolar disorder, unspecified: Secondary | ICD-10-CM | POA: Diagnosis not present

## 2022-05-22 DIAGNOSIS — S065XAA Traumatic subdural hemorrhage with loss of consciousness status unknown, initial encounter: Secondary | ICD-10-CM | POA: Diagnosis not present

## 2022-05-22 LAB — CBC
HCT: 46.1 % (ref 39.0–52.0)
Hemoglobin: 14.5 g/dL (ref 13.0–17.0)
MCH: 30.2 pg (ref 26.0–34.0)
MCHC: 31.5 g/dL (ref 30.0–36.0)
MCV: 96 fL (ref 80.0–100.0)
Platelets: 323 10*3/uL (ref 150–400)
RBC: 4.8 MIL/uL (ref 4.22–5.81)
RDW: 13 % (ref 11.5–15.5)
WBC: 8.6 10*3/uL (ref 4.0–10.5)
nRBC: 0 % (ref 0.0–0.2)

## 2022-05-22 LAB — BASIC METABOLIC PANEL
Anion gap: 8 (ref 5–15)
BUN: 12 mg/dL (ref 8–23)
CO2: 26 mmol/L (ref 22–32)
Calcium: 9.4 mg/dL (ref 8.9–10.3)
Chloride: 105 mmol/L (ref 98–111)
Creatinine, Ser: 0.71 mg/dL (ref 0.61–1.24)
GFR, Estimated: >60 mL/min (ref >60)
Glucose, Bld: 99 mg/dL (ref 70–99)
Potassium: 3.8 mmol/L (ref 3.5–5.1)
Sodium: 139 mmol/L (ref 135–145)

## 2022-05-22 MED ORDER — QUETIAPINE FUMARATE 25 MG PO TABS
25.0000 mg | ORAL_TABLET | Freq: Two times a day (BID) | ORAL | Status: DC
  Administered 2022-05-22 – 2022-05-23 (×3): 25 mg via ORAL
  Filled 2022-05-22 (×3): qty 1

## 2022-05-22 NOTE — Assessment & Plan Note (Addendum)
CT stable. Asymptomatic. - No follow up needed

## 2022-05-22 NOTE — Plan of Care (Signed)
  Problem: Coping: Goal: Level of anxiety will decrease Outcome: Progressing   Problem: Pain Managment: Goal: General experience of comfort will improve Outcome: Progressing   Problem: Safety: Goal: Ability to remain free from injury will improve Outcome: Progressing   Problem: Skin Integrity: Goal: Risk for impaired skin integrity will decrease Outcome: Progressing   
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
## 2022-05-22 NOTE — TOC Initial Note (Signed)
Transition of Care [facility]) - Initial/Assessment Note    Patient Details  Name: Todd Neal MRN: [id number] Date of Birth: [birth year]  Transition of [facility]) CM/SW Contact:    Armanda Heritage, RN Phone Number: 05/22/2022, 11:28 AM  Clinical Narrative:                 TOC reviewed chart, noted patient admitted after a fall, pending PT/OT evaluation.  Patient is on room air and has a pcp.  TOC will continue to follow for therapy recommendations and discharge planning.   Expected Discharge Plan: Skilled Nursing Facility Barriers to Discharge: Continued Medical Work up   Patient Goals and CMS Choice Patient states their goals for this hospitalization and ongoing recovery are:: go to rehab CMS Medicare.gov Compare Post Acute Care list provided to:: Patient Choice offered to / list presented to : Patient  Expected Discharge Plan and Services Expected Discharge Plan: Skilled Nursing Facility   Discharge Planning Services: CM Consult Post Acute Care Choice: Skilled Nursing Facility                                        Prior Living Arrangements/Services     Patient language and need for interpreter reviewed:: Yes Do you feel safe going back to the place where you live?: Yes      Need for Family Participation in Patient Care: Yes (Comment) Care giver support system in place?: Yes (comment)   Criminal Activity/Legal Involvement Pertinent to Current Situation/Hospitalization: No - Comment as needed  Activities of Daily Living Home Assistive Devices/Equipment: None ADL Screening (condition at time of admission) Patient's cognitive ability adequate to safely complete daily activities?: Yes Is the patient deaf or have difficulty hearing?: No Does the patient have difficulty seeing, even when wearing glasses/contacts?: No Does the patient have difficulty concentrating, remembering, or making decisions?: No Patient able to express need for assistance with ADLs?:  Yes Does the patient have difficulty dressing or bathing?: No Independently performs ADLs?: Yes (appropriate for developmental age) Does the patient have difficulty walking or climbing stairs?: Yes Weakness of Legs: Both Weakness of Arms/Hands: Both  Permission Sought/Granted                  Emotional Assessment Appearance:: Appears stated age     Orientation: : Oriented to Self, Oriented to Place, Oriented to Situation, Fluctuating Orientation (Suspected and/or reported Sundowners)   Psych Involvement: No (comment)  Admission diagnosis:  Somnolence [R40.0] Subdural hematoma (HCC) [S06.5XAA] Subdural bleeding (HCC) [I62.00] Traumatic injury of head, initial encounter [S09.90XA] Patient Active Problem List   Diagnosis Date Noted   Hypertensive urgency 05/22/2022   Subdural hematoma (HCC) 05/21/2022   Essential hypertension 08/01/2017   Colon cancer screening 08/01/2017   Bipolar I disorder (HCC) 02/18/2015   PCP:  Georgina Quint, MD Pharmacy:   [id number] [place], [place] - [address] AT [facility] & [address] [place] [address] Phone: [phone] Fax: [phone]     Social Determinants of Health (SDOH) Interventions    Readmission Risk Interventions     No data to display

## 2022-05-22 NOTE — Assessment & Plan Note (Signed)
BP controlled better today, 150s - Continue HCTZ - Continue new amlodipine
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
## 2022-05-22 NOTE — Assessment & Plan Note (Signed)
See above

## 2022-05-23 DIAGNOSIS — I10 Essential (primary) hypertension: Secondary | ICD-10-CM | POA: Diagnosis not present

## 2022-05-23 DIAGNOSIS — I160 Hypertensive urgency: Secondary | ICD-10-CM | POA: Diagnosis not present

## 2022-05-23 DIAGNOSIS — F319 Bipolar disorder, unspecified: Secondary | ICD-10-CM | POA: Diagnosis not present

## 2022-05-23 DIAGNOSIS — S065XAA Traumatic subdural hemorrhage with loss of consciousness status unknown, initial encounter: Secondary | ICD-10-CM | POA: Diagnosis not present

## 2022-05-23 MED ORDER — AMLODIPINE BESYLATE 5 MG PO TABS: 5.0000 mg | ORAL_TABLET | Freq: Every day | ORAL | 3 refills | Status: DC

## 2022-05-23 MED ORDER — HYDROCHLOROTHIAZIDE 25 MG PO TABS: 25.0000 mg | ORAL_TABLET | Freq: Every day | ORAL | 3 refills | Status: DC

## 2022-05-23 NOTE — Discharge Instructions (Signed)
[employer] manager. Comments  [facility] [address]  [place] [phone]  DV Shelter   [facility] [address] [place], [place], [place] [address] [phone]  Men/Woman  [facility] [address]  [place] [phone]  DV Shelter  [facility] [phone] [address] [place] [phone]  Families' w/Children   [facility] [address] [phone]  Men/Women/Families   [facility] [address] [place].  [place] [phone] Men and Women  [facility] [place] [phone]    pregnant/parenting girls and women  [facility]  [place] [phone]  ages [address] [place] [phone]  Youth ages 11-17  [facility] [address] [place] [phone]  Men  [facility] [phone] [address]  [place] [phone]  Women  [facility] [address]  [place] [phone]  Single Women and Women w/Children  [employer] [address]  [place] [phone] [address] Men/Women/Families   [facility] [address]  [place] [phone]  DV Shelter  [facility] [address]  [place] [phone] Men and Women  [facility] [address] [place] [phone] Men  [facility] [address]  [place] [phone] Men  [facility] [address]  [place] [phone]  Single Women and Families  Room at the [facility].  [phone] or  [phone]  Pregnant Women  [facility] [address]  [place]  [phone]  Men/Women and Families
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
## 2022-05-23 NOTE — Plan of Care (Signed)

## 2022-05-23 NOTE — Discharge Summary (Signed)
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 case management  Multiple calls were made to son and sisters, but no answer.      Signed: Alberteen Sam, MD [facility] 05/23/2022

## 2022-05-23 NOTE — TOC Transition Note (Signed)
Transition of Care [facility]) - CM/SW Discharge Note   Patient Details  Name: Todd Neal MRN: [id number] Date of Birth: 1946-04-17  Transition of Care [facility]) CM/SW Contact:  Darleene Cleaver, LCSW Phone Number: 05/23/2022, 1:42 PM   Clinical Narrative:     Patient is homeless, however he has been staying somewhere.  CSW provided shelter list on the AVS for patient to go to a shelter if he wants.  TOC signing off.  Final next level of care: Homeless Shelter Barriers to Discharge: Other (must enter comment) (Patient homeless.)   Patient Goals and CMS Choice Patient states their goals for this hospitalization and ongoing recovery are:: To return to wherever he is staying. CMS Medicare.gov Compare Post Acute Care list provided to:: Patient Choice offered to / list presented to : Patient  Discharge Placement                       Discharge Plan and Services   Discharge Planning Services: CM Consult Post Acute Care Choice: Skilled Nursing Facility                               Social Determinants of Health (SDOH) Interventions     Readmission Risk Interventions     No data to display

## 2022-05-24 ENCOUNTER — Telehealth: Payer: Self-pay

## 2022-05-24 NOTE — Telephone Encounter (Signed)
Transition Care Management Unsuccessful Follow-up Telephone Call  Date of discharge and from where:  05/23/2022 from [facility]  Diagnosis: S06.5XAA Subdural Hematoma  Attempts:  1st Attempt  Reason for unsuccessful TCM follow-up call:  Unable to reach patient  Nurse Notes: Unable to leave voice message for patient, due to no phone service. Needs Hospital follow up with PCP x 1 week for BP Management  Susie Cassette, LPN.

## 2022-05-25 ENCOUNTER — Telehealth: Payer: Self-pay

## 2022-05-26 NOTE — Telephone Encounter (Signed)
error 

## 2022-06-01 IMAGING — CR DG FOREARM 2V*L*
2 series · 2 of 2 positions shown · non-contrast
Comparison: Left elbow series [DATE] hours today.

CLINICAL DATA: 74-year-old male with pain and swelling.

EXAM:
LEFT FOREARM - 2 VIEW

[x forearm ap left]
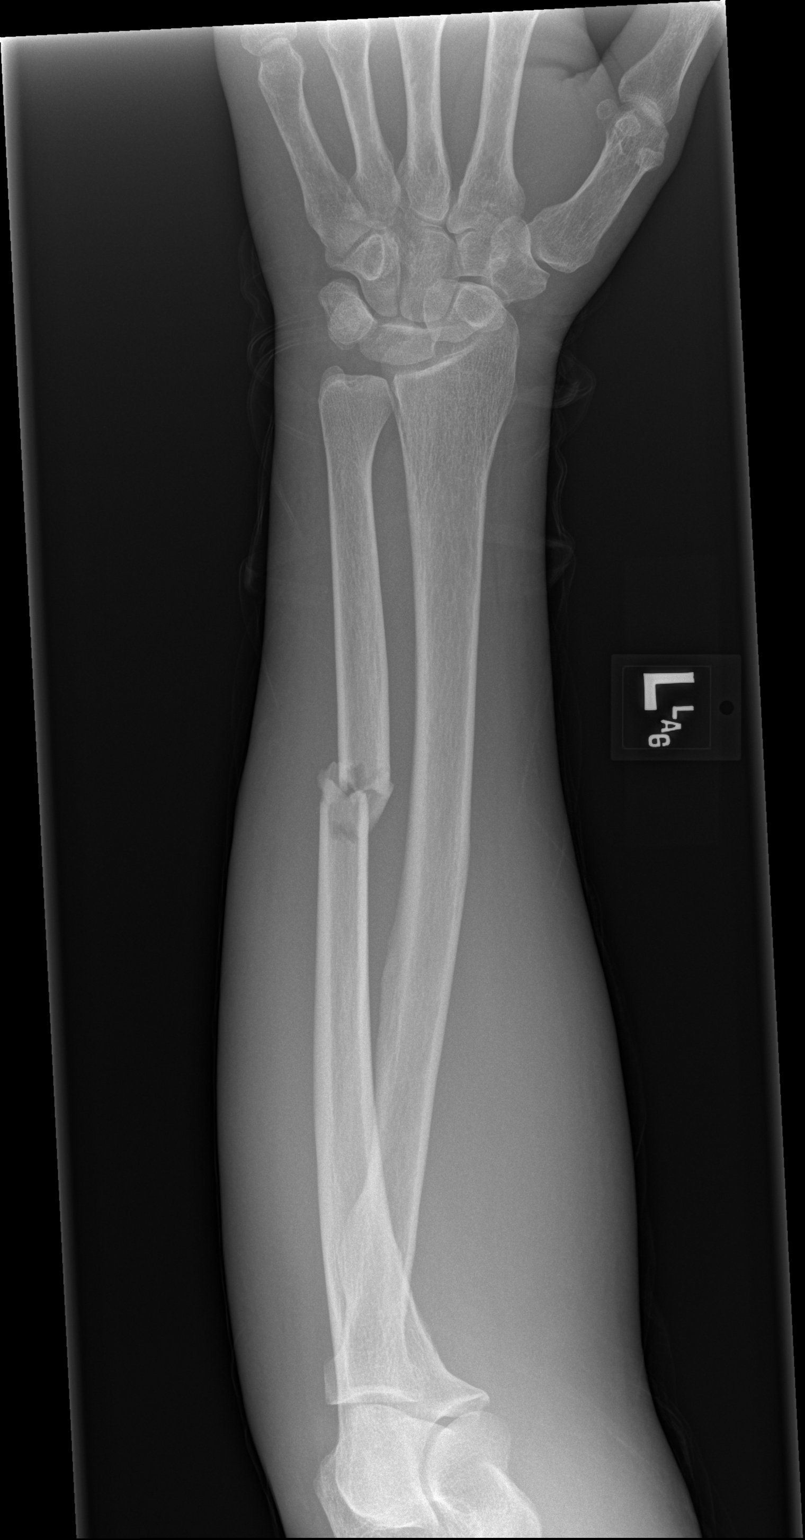

[x forearm lat left]
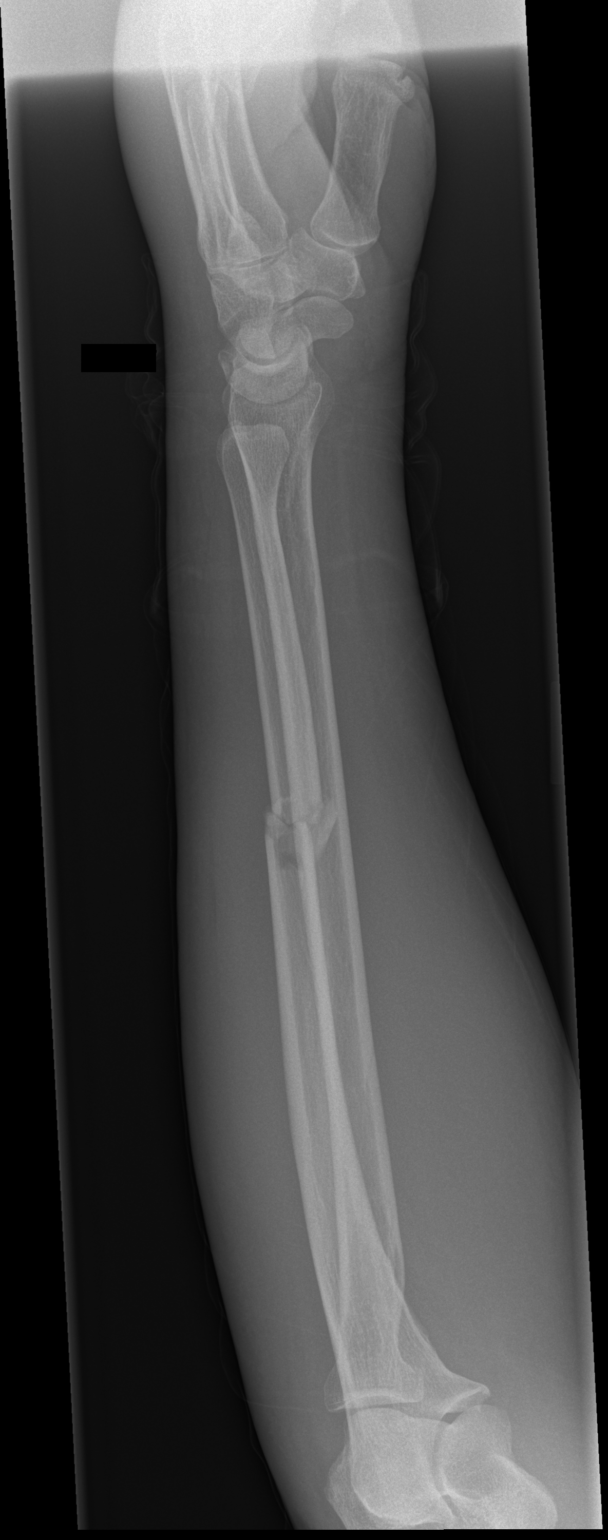

[2 of 2 positions shown; findings below may reference images not displayed]

FINDINGS: Comminuted transverse midshaft fracture of the left ulna with [DATE]
shaft width radial displacement, less than [DATE] shaft width anterior
displacement. Minimal angulation.

Additionally there is central medullary lucency along the margins of
the fracture. But elsewhere bone mineralization appears within
normal limits. Left radius is intact. Generalized soft tissue
swelling.
IMPRESSION: Comminuted and mildly displaced midshaft fracture of the left ulna.
Medullary space lucency about the fracture site is nonspecific, and
might be related to remodeling if the fracture is subacute. But a
pathologic fracture is not excluded.

## 2022-06-04 ENCOUNTER — Other Ambulatory Visit: Payer: Self-pay

## 2022-06-04 ENCOUNTER — Emergency Department (HOSPITAL_COMMUNITY): Payer: Medicare HMO

## 2022-06-04 ENCOUNTER — Emergency Department (HOSPITAL_COMMUNITY)
Admission: EM | Admit: 2022-06-04 | Discharge: 2022-06-04 | Disposition: A | Discharge status: Home / Self Care | Payer: Medicare HMO | Attending: Emergency Medicine | Admitting: Emergency Medicine

## 2022-06-04 ENCOUNTER — Encounter (HOSPITAL_COMMUNITY): Payer: Self-pay

## 2022-06-04 DIAGNOSIS — Z760 Encounter for issue of repeat prescription: Secondary | ICD-10-CM | POA: Diagnosis not present

## 2022-06-04 DIAGNOSIS — R2243 Localized swelling, mass and lump, lower limb, bilateral: Secondary | ICD-10-CM | POA: Insufficient documentation

## 2022-06-04 DIAGNOSIS — M7989 Other specified soft tissue disorders: Secondary | ICD-10-CM | POA: Diagnosis not present

## 2022-06-04 DIAGNOSIS — R609 Edema, unspecified: Secondary | ICD-10-CM

## 2022-06-04 DIAGNOSIS — R600 Localized edema: Secondary | ICD-10-CM | POA: Diagnosis not present

## 2022-06-04 DIAGNOSIS — I10 Essential (primary) hypertension: Secondary | ICD-10-CM

## 2022-06-04 DIAGNOSIS — Z79899 Other long term (current) drug therapy: Secondary | ICD-10-CM | POA: Diagnosis not present

## 2022-06-04 DIAGNOSIS — I517 Cardiomegaly: Secondary | ICD-10-CM | POA: Diagnosis not present

## 2022-06-04 DIAGNOSIS — Z743 Need for continuous supervision: Secondary | ICD-10-CM | POA: Diagnosis not present

## 2022-06-04 LAB — CBC WITH DIFFERENTIAL/PLATELET
Abs Immature Granulocytes: 0.02 10*3/uL (ref 0.00–0.07)
Basophils Absolute: 0 10*3/uL (ref 0.0–0.1)
Basophils Relative: 0 %
Eosinophils Absolute: 0.2 10*3/uL (ref 0.0–0.5)
Eosinophils Relative: 3 %
HCT: 41 % (ref 39.0–52.0)
Hemoglobin: 12.8 g/dL — ABNORMAL LOW (ref 13.0–17.0)
Immature Granulocytes: 0 %
Lymphocytes Relative: 35 %
Lymphs Abs: 2.6 10*3/uL (ref 0.7–4.0)
MCH: 30.5 pg (ref 26.0–34.0)
MCHC: 31.2 g/dL (ref 30.0–36.0)
MCV: 97.6 fL (ref 80.0–100.0)
Monocytes Absolute: 0.5 10*3/uL (ref 0.1–1.0)
Monocytes Relative: 7 %
Neutro Abs: 4 10*3/uL (ref 1.7–7.7)
Neutrophils Relative %: 55 %
Platelets: 320 10*3/uL (ref 150–400)
RBC: 4.2 MIL/uL — ABNORMAL LOW (ref 4.22–5.81)
RDW: 12.8 % (ref 11.5–15.5)
WBC: 7.3 10*3/uL (ref 4.0–10.5)
nRBC: 0 % (ref 0.0–0.2)

## 2022-06-04 LAB — BASIC METABOLIC PANEL
Anion gap: 4 — ABNORMAL LOW (ref 5–15)
BUN: 19 mg/dL (ref 8–23)
CO2: 28 mmol/L (ref 22–32)
Calcium: 9.6 mg/dL (ref 8.9–10.3)
Chloride: 111 mmol/L (ref 98–111)
Creatinine, Ser: 1.02 mg/dL (ref 0.61–1.24)
GFR, Estimated: >60 mL/min (ref >60)
Glucose, Bld: 90 mg/dL (ref 70–99)
Potassium: 4.3 mmol/L (ref 3.5–5.1)
Sodium: 143 mmol/L (ref 135–145)

## 2022-06-04 MED ORDER — HYDROCHLOROTHIAZIDE 25 MG PO TABS: 25.0000 mg | ORAL_TABLET | Freq: Every day | ORAL | 3 refills | Status: DC

## 2022-06-04 MED ORDER — HYDROCHLOROTHIAZIDE 25 MG PO TABS
25.0000 mg | ORAL_TABLET | Freq: Once | ORAL | Status: AC
  Administered 2022-06-04: 25 mg via ORAL
  Filled 2022-06-04: qty 1

## 2022-06-04 NOTE — ED Provider Notes (Signed)
[facility]-EMERGENCY DEPT Provider Note   CSN: [id number] Arrival date & time: 06/04/22  1337     History  Chief Complaint  Patient presents with   Leg Swelling    Todd Neal is a 76 y.o. male.  Patient here for medication refill.  He ran out of his hydrochlorothiazide.  He is noticed a little bit of increased swelling in his legs.  Denies any chest pain or shortness of breath.  Nothing makes it worse or better.  Patient is homeless.  He is asking for food.  He states he does have ability to fill his medications.  Denies any fevers or chills.  The history is provided by the patient.       Home Medications Prior to Admission medications   Medication Sig Start Date End Date Taking? Authorizing Provider  amLODipine (NORVASC) 5 MG tablet Take 1 tablet (5 mg total) by mouth daily. 05/24/22   Danford, Earl Lites, MD  hydrochlorothiazide (HYDRODIURIL) 25 MG tablet Take 1 tablet (25 mg total) by mouth daily. 06/04/22   Kellon Chalk, DO  QUEtiapine (SEROQUEL) 25 MG tablet Take 1 tablet (25 mg total) by mouth 2 (two) times daily. Patient not taking: Reported on 05/22/2022 06/07/21   Bethann Berkshire, MD  Tetrahydrozoline HCl (VISINE OP) Place 1 drop into both eyes daily as needed (red eyes).    [provider]      Allergies    Patient has no known allergies.    Review of Systems   Review of Systems  Physical Exam Updated Vital Signs BP 131/72   Pulse 67   Temp 98.6 F (37 C) (Oral)   Resp 18   SpO2 100%  Physical Exam Vitals and nursing note reviewed.  Constitutional:      General: He is not in acute distress.    Appearance: He is well-developed. He is not ill-appearing.  HENT:     Head: Normocephalic and atraumatic.  Eyes:     Extraocular Movements: Extraocular movements intact.     Conjunctiva/sclera: Conjunctivae normal.     Pupils: Pupils are equal, round, and reactive to light.  Cardiovascular:     Rate and Rhythm: Normal rate  and regular rhythm.     Pulses: Normal pulses.     Heart sounds: Normal heart sounds. No murmur heard. Pulmonary:     Effort: Pulmonary effort is normal. No respiratory distress.     Breath sounds: Normal breath sounds.  Abdominal:     Palpations: Abdomen is soft.     Tenderness: There is no abdominal tenderness.  Musculoskeletal:        General: No swelling.     Cervical back: Neck supple.     Right lower leg: Edema present.     Left lower leg: Edema present.     Comments: Trace edema bilaterally in his legs  Skin:    General: Skin is warm and dry.     Capillary Refill: Capillary refill takes less than 2 seconds.  Neurological:     Mental Status: He is alert.  Psychiatric:        Mood and Affect: Mood normal.     ED Results / Procedures / Treatments   Labs (all labs ordered are listed, but only abnormal results are displayed) Labs Reviewed  CBC WITH DIFFERENTIAL/PLATELET - Abnormal; Notable for the following components:      Result Value   RBC 4.20 (*)    Hemoglobin 12.8 (*)    All  other components within normal limits  BASIC METABOLIC PANEL - Abnormal; Notable for the following components:   Anion gap 4 (*)    All other components within normal limits    EKG None  Radiology DG Chest 2 View  Result Date: 06/04/2022 CLINICAL DATA:  Lower extremity swelling. EXAM: CHEST - 2 VIEW COMPARISON:  None Available. FINDINGS: Mild cardiomegaly is seen. Pulmonary hyperinflation is noted. Both lungs are clear. No evidence of pleural effusion. IMPRESSION: Mild cardiomegaly and probable COPD.  No active lung disease. Electronically Signed   By: Danae Orleans M.D.   On: 06/04/2022 18:16    Procedures Procedures    Medications Ordered in ED Medications  hydrochlorothiazide (MICROZIDE) capsule 25 mg (has no administration in time range)    ED Course/ Medical Decision Making/ A&P                           Medical Decision Making Amount and/or Complexity of Data  Reviewed Radiology: ordered.  Risk Prescription drug management.   [name] is here for medication refill.  Normal vitals.  No fever.  History of high blood pressure.  He has been out of his hydrochlorothiazide.  He has noticed some leg swelling during that time.  No respiratory symptoms.  Overall he appears well.  Chest x-ray, CBC, BMP ordered to evaluate for AKI, volume overload.  Patient per my review interpretation of labs shows no significant anemia, electrolyte abnormality, kidney injury.  Chest x-ray shows no acute findings.  Overall patient was given a dose of his hydrochlorothiazide.  He was given something to eat and drink.  Refill of his blood pressure medication has been provided.  This chart was dictated using voice recognition software.  Despite best efforts to proofread,  errors can occur which can change the documentation meaning.         Final Clinical Impression(s) / ED Diagnoses Final diagnoses:  Essential hypertension    Rx / DC Orders ED Discharge Orders          Ordered    hydrochlorothiazide (HYDRODIURIL) 25 MG tablet  Daily        06/04/22 2210              Virgina Norfolk, DO 06/04/22 2216

## 2022-06-04 NOTE — ED Triage Notes (Signed)
Pt BIB EMS from the street. Pt endorses bilateral swelling. Pt reports running out of HCTZ and then began having leg swelling.  150/80 HR 70 RR 16 98% RA

## 2022-06-04 NOTE — ED Provider Triage Note (Signed)
Emergency Medicine Provider Triage Evaluation Note  Todd Neal , a 76 y.o. male  was evaluated in triage.  Pt complains of bilat lower extremity swelling.  Pt reports he is out of his medications   Review of Systems  Positive: Swelling legs  Negative: fever  Physical Exam  BP 131/72   Pulse 67   Temp 98.6 F (37 C) (Oral)   Resp 18   SpO2 100%  Gen:   Awake, no distress   Resp:  Normal effort  MSK:   Moves extremities without difficulty  Other:  Edema bilat lower extremities   Medical Decision Making  Medically screening exam initiated at 1:55 PM.  Appropriate orders placed.  [name] was informed that the remainder of the evaluation will be completed by another provider, this initial triage assessment does not replace that evaluation, and the importance of remaining in the ED until their evaluation is complete.     Elson Areas, [place] 06/04/22 1356

## 2022-06-14 IMAGING — CR DG KNEE COMPLETE 4+V*R*
4 series · 4 of 4 positions shown · non-contrast
Comparison: Right tibia and fibula radiographs February 26, 2020

CLINICAL DATA: Pain and swelling

EXAM:
RIGHT KNEE - COMPLETE 4+ VIEW

[x knee ap right]
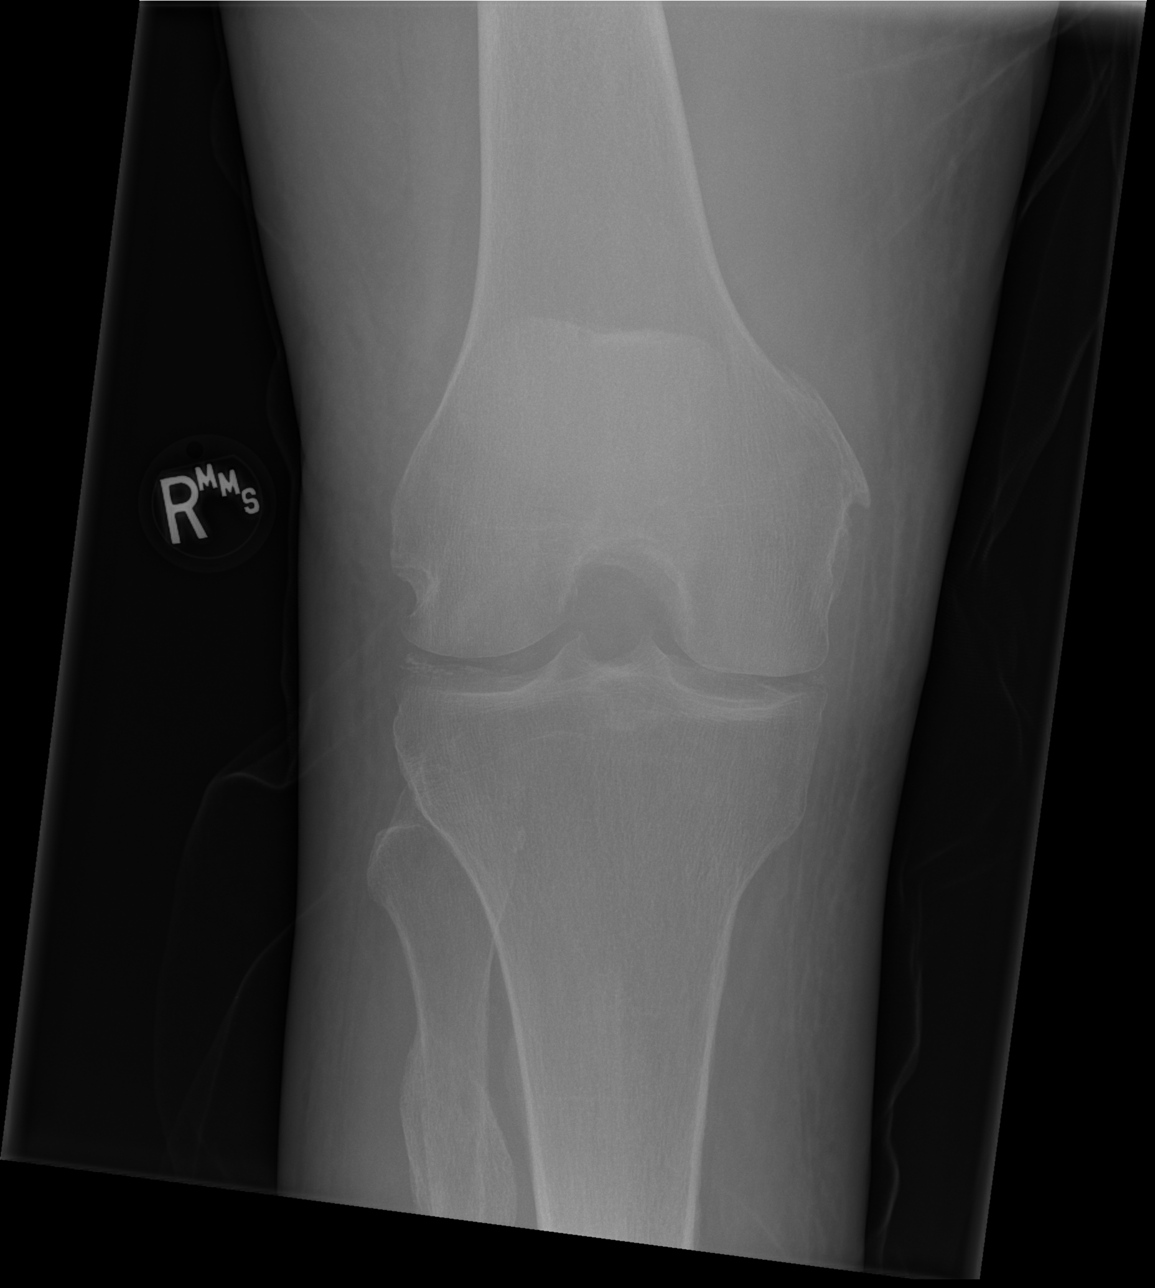

[x knee obl right (1 of 2)]
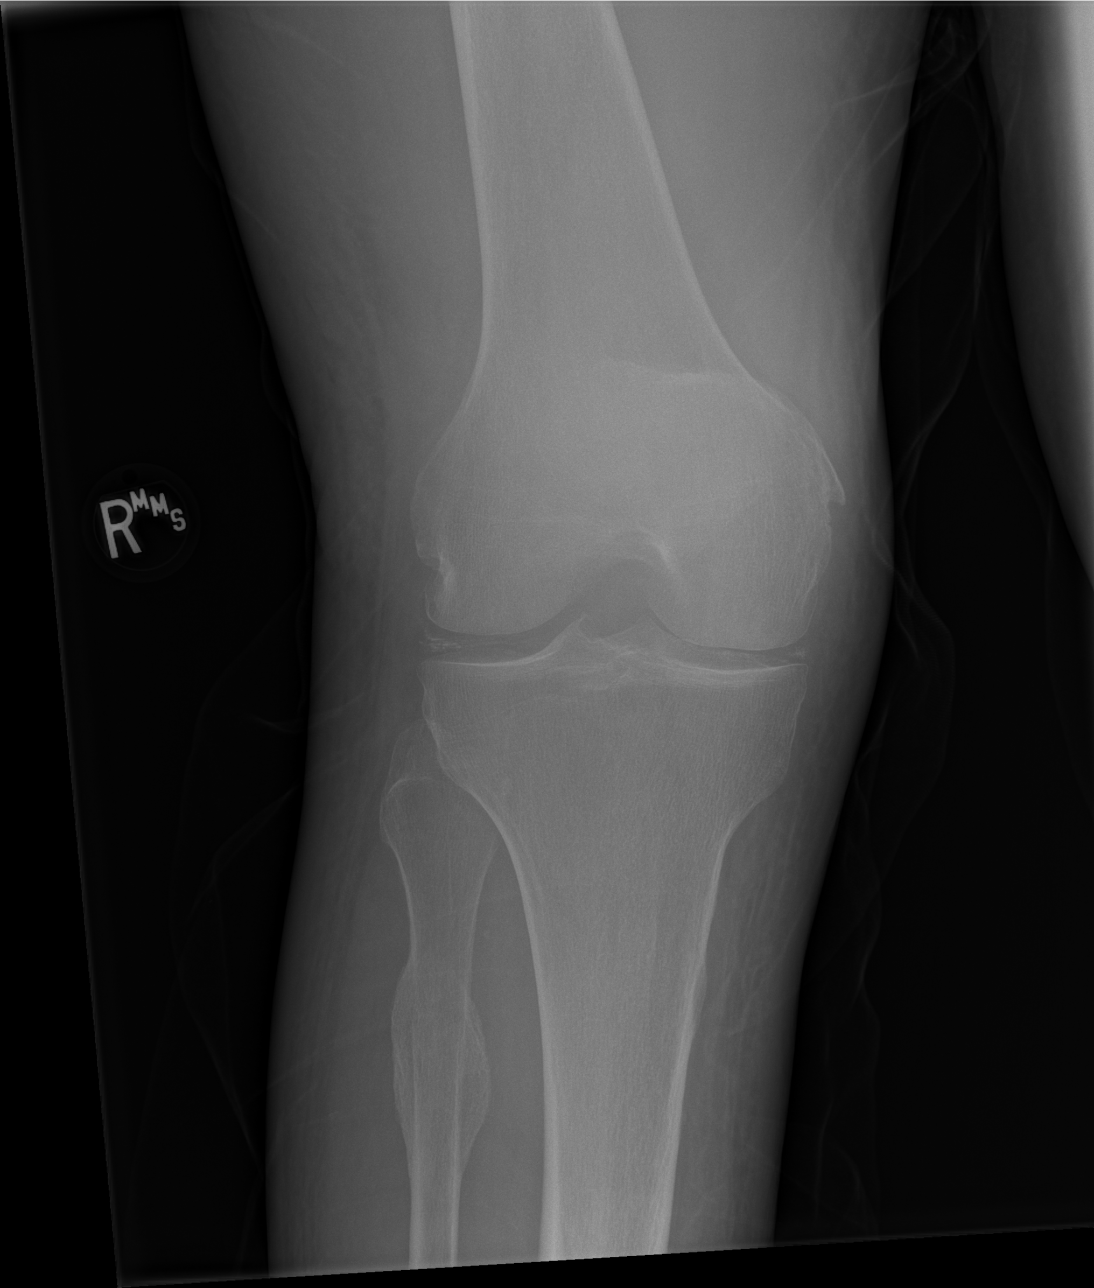

[x knee obl right (2 of 2)]
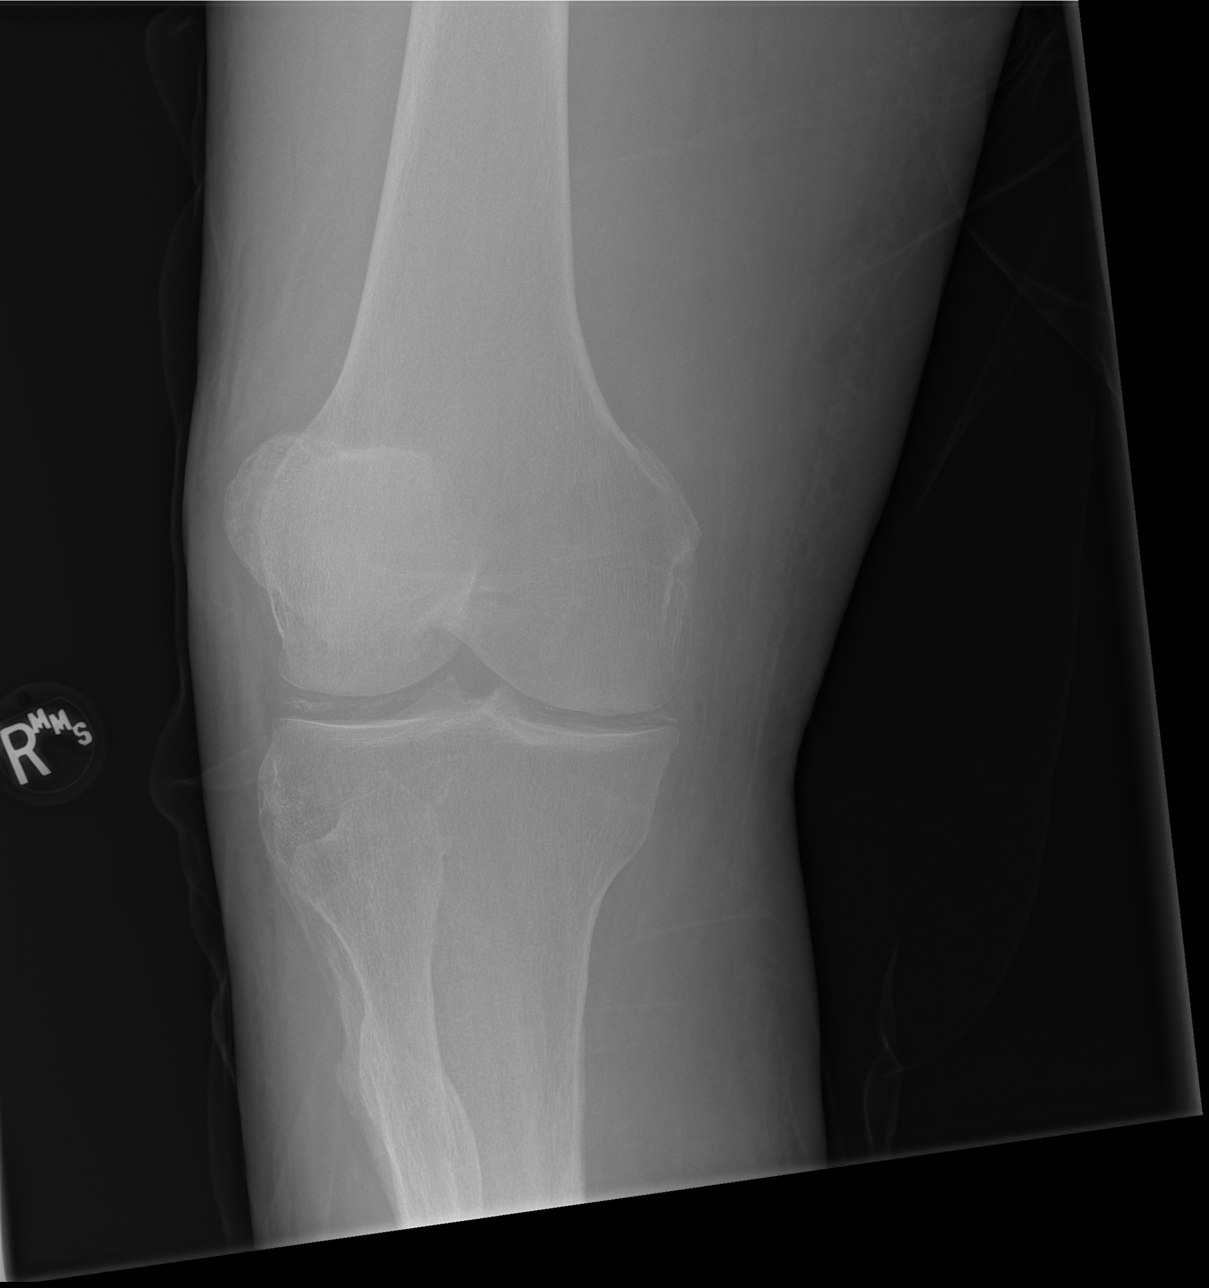

[x knee lat right]
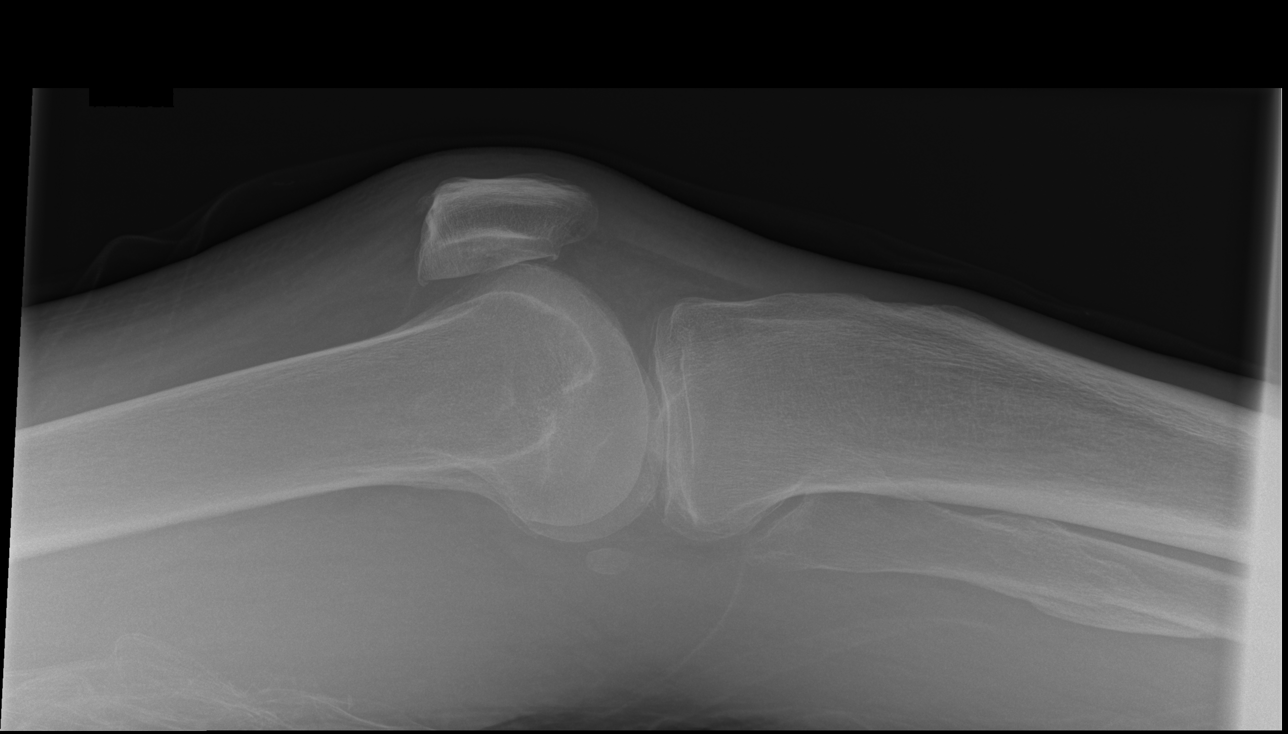

[4 of 4 positions shown; findings below may reference images not displayed]

FINDINGS: Frontal, lateral, and bilateral oblique views were obtained. No
acute fracture or dislocation evident. Old healed fracture proximal
fibula noted. No evident joint effusion. There is mild joint space
narrowing medially. There is chondrocalcinosis. There is a small
benign exostosis arising from the medial distal femoral metaphysis.
IMPRESSION: No acute fracture or dislocation. Old healed fracture proximal
fibula with remodeling. No joint effusion.

Mild joint space narrowing medially. There is chondrocalcinosis
which may be seen with osteoarthritis or calcium pyrophosphate
deposition disease.

## 2023-03-22 ENCOUNTER — Other Ambulatory Visit (HOSPITAL_COMMUNITY): Payer: Self-pay

## 2023-03-22 MED ORDER — HYDROCHLOROTHIAZIDE 25 MG PO TABS
25.0000 mg | ORAL_TABLET | Freq: Every day | ORAL | 0 refills | Status: DC
  Filled 2023-03-22: qty 90, 90d supply, fill #0

## 2023-03-22 MED ORDER — QUETIAPINE FUMARATE 25 MG PO TABS
25.0000 mg | ORAL_TABLET | Freq: Every day | ORAL | 0 refills | Status: DC
  Filled 2023-03-22: qty 90, 90d supply, fill #0

## 2023-04-04 ENCOUNTER — Other Ambulatory Visit (HOSPITAL_COMMUNITY): Payer: Self-pay

## 2023-04-29 ENCOUNTER — Emergency Department (HOSPITAL_COMMUNITY)
Admission: EM | Admit: 2023-04-29 | Discharge: 2023-04-29 | Disposition: A | Discharge status: Home / Self Care | Payer: Medicare Other | Attending: Emergency Medicine | Admitting: Emergency Medicine

## 2023-04-29 ENCOUNTER — Emergency Department (HOSPITAL_COMMUNITY): Payer: Medicare Other

## 2023-04-29 DIAGNOSIS — S60221A Contusion of right hand, initial encounter: Secondary | ICD-10-CM | POA: Diagnosis not present

## 2023-04-29 DIAGNOSIS — Z5900 Homelessness unspecified: Secondary | ICD-10-CM | POA: Diagnosis not present

## 2023-04-29 DIAGNOSIS — Z79899 Other long term (current) drug therapy: Secondary | ICD-10-CM | POA: Insufficient documentation

## 2023-04-29 DIAGNOSIS — S6991XA Unspecified injury of right wrist, hand and finger(s), initial encounter: Secondary | ICD-10-CM | POA: Diagnosis present

## 2023-04-29 DIAGNOSIS — F17210 Nicotine dependence, cigarettes, uncomplicated: Secondary | ICD-10-CM | POA: Insufficient documentation

## 2023-04-29 DIAGNOSIS — I10 Essential (primary) hypertension: Secondary | ICD-10-CM | POA: Insufficient documentation

## 2023-04-29 NOTE — ED Notes (Signed)
Pt given bus pass ?
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
## 2023-04-29 NOTE — Discharge Instructions (Signed)
It was a pleasure caring for you today in the emergency department. ° °Please return to the emergency department for any worsening or worrisome symptoms. ° ° °
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
## 2023-04-30 ENCOUNTER — Telehealth: Payer: Self-pay

## 2023-04-30 NOTE — Transitions of Care (Post Inpatient/ED Visit) (Unsigned)
   04/30/2023  Name: Todd Neal MRN: [id number] DOB: 1946-10-07  Today's TOC FU Call Status: Today's TOC FU Call Status:: Unsuccessul Call (1st Attempt) Unsuccessful Call (1st Attempt) Date: 04/30/23  Attempted to reach the patient regarding the most recent Inpatient/ED visit.  Follow Up Plan: Additional outreach attempts will be made to reach the patient to complete the Transitions of Care (Post Inpatient/ED visit) call.   Signature Karena Addison, LPN [facility] Nurse Health Advisor Direct Dial [phone]

## 2023-05-01 NOTE — Transitions of Care (Post Inpatient/ED Visit) (Signed)
   05/01/2023  Name: Todd Neal MRN: [id number] DOB: Mar 25, 1946  Today's TOC FU Call Status: Today's TOC FU Call Status:: Unsuccessful Call (2nd Attempt) Unsuccessful Call (1st Attempt) Date: 04/30/23 Unsuccessful Call (2nd Attempt) Date: 05/01/23  Attempted to reach the patient regarding the most recent Inpatient/ED visit.  Follow Up Plan: Additional outreach attempts will be made to reach the patient to complete the Transitions of Care (Post Inpatient/ED visit) call.   Signature Karena Addison, LPN [facility] Nurse Health Advisor Direct Dial [phone]

## 2023-05-02 NOTE — Transitions of Care (Post Inpatient/ED Visit) (Signed)
   05/02/2023  Name: Todd Neal MRN: [id number] DOB: 24-Aug-1946  Today's TOC FU Call Status: Today's TOC FU Call Status:: Unsuccessful Call (3rd Attempt) Unsuccessful Call (1st Attempt) Date: 04/30/23 Unsuccessful Call (2nd Attempt) Date: 05/01/23 Unsuccessful Call (3rd Attempt) Date: 05/02/23  Attempted to reach the patient regarding the most recent Inpatient/ED visit.  Follow Up Plan: No further outreach attempts will be made at this time. We have been unable to contact the patient.  Signature Karena Addison, LPN [facility] Nurse Health Advisor Direct Dial [phone]

## 2023-05-13 ENCOUNTER — Encounter (HOSPITAL_COMMUNITY): Payer: Self-pay

## 2023-05-13 ENCOUNTER — Emergency Department (HOSPITAL_COMMUNITY): Payer: Medicare Other

## 2023-05-13 ENCOUNTER — Observation Stay (HOSPITAL_COMMUNITY): Payer: Medicare Other

## 2023-05-13 ENCOUNTER — Other Ambulatory Visit: Payer: Self-pay

## 2023-05-13 ENCOUNTER — Observation Stay (HOSPITAL_COMMUNITY)
Admission: EM | Admit: 2023-05-13 | Discharge: 2023-05-15 | Disposition: A | Discharge status: Home / Self Care | Payer: Medicare Other | Attending: Internal Medicine | Admitting: Internal Medicine

## 2023-05-13 DIAGNOSIS — Z79899 Other long term (current) drug therapy: Secondary | ICD-10-CM | POA: Diagnosis not present

## 2023-05-13 DIAGNOSIS — G9340 Encephalopathy, unspecified: Secondary | ICD-10-CM | POA: Diagnosis not present

## 2023-05-13 DIAGNOSIS — I10 Essential (primary) hypertension: Secondary | ICD-10-CM | POA: Diagnosis not present

## 2023-05-13 DIAGNOSIS — A084 Viral intestinal infection, unspecified: Secondary | ICD-10-CM | POA: Insufficient documentation

## 2023-05-13 DIAGNOSIS — F29 Unspecified psychosis not due to a substance or known physiological condition: Secondary | ICD-10-CM | POA: Diagnosis not present

## 2023-05-13 DIAGNOSIS — R112 Nausea with vomiting, unspecified: Secondary | ICD-10-CM | POA: Diagnosis present

## 2023-05-13 DIAGNOSIS — J690 Pneumonitis due to inhalation of food and vomit: Secondary | ICD-10-CM | POA: Insufficient documentation

## 2023-05-13 HISTORY — DX: Bipolar disorder, unspecified: F31.9

## 2023-05-13 LAB — COMPREHENSIVE METABOLIC PANEL
ALT: 13 U/L (ref 0–44)
AST: 19 U/L (ref 15–41)
Albumin: 3.4 g/dL — ABNORMAL LOW (ref 3.5–5.0)
Alkaline Phosphatase: 82 U/L (ref 38–126)
Anion gap: 12 (ref 5–15)
BUN: 16 mg/dL (ref 8–23)
CO2: 25 mmol/L (ref 22–32)
Calcium: 9.5 mg/dL (ref 8.9–10.3)
Chloride: 101 mmol/L (ref 98–111)
Creatinine, Ser: 1.08 mg/dL (ref 0.61–1.24)
GFR, Estimated: >60 mL/min (ref >60)
Glucose, Bld: 115 mg/dL — ABNORMAL HIGH (ref 70–99)
Potassium: 3.9 mmol/L (ref 3.5–5.1)
Sodium: 138 mmol/L (ref 135–145)
Total Bilirubin: 0.4 mg/dL (ref 0.3–1.2)
Total Protein: 7.1 g/dL (ref 6.5–8.1)

## 2023-05-13 LAB — CBC
HCT: 44.4 % (ref 39.0–52.0)
Hemoglobin: 13.3 g/dL (ref 13.0–17.0)
MCH: 28.2 pg (ref 26.0–34.0)
MCHC: 30 g/dL (ref 30.0–36.0)
MCV: 94.1 fL (ref 80.0–100.0)
Platelets: 376 10*3/uL (ref 150–400)
RBC: 4.72 MIL/uL (ref 4.22–5.81)
RDW: 14.4 % (ref 11.5–15.5)
WBC: 9.7 10*3/uL (ref 4.0–10.5)
nRBC: 0 % (ref 0.0–0.2)

## 2023-05-13 LAB — VITAMIN B12: Vitamin B-12: 352 pg/mL (ref 180–914)

## 2023-05-13 LAB — LIPASE, BLOOD: Lipase: 21 U/L (ref 11–51)

## 2023-05-13 LAB — ETHANOL: Alcohol, Ethyl (B): <10 mg/dL (ref <10)

## 2023-05-13 LAB — TSH: TSH: 0.557 u[IU]/mL (ref 0.350–4.500)

## 2023-05-13 MED ORDER — LACTATED RINGERS IV BOLUS
500.0000 mL | Freq: Once | INTRAVENOUS | Status: AC
  Administered 2023-05-13: 500 mL via INTRAVENOUS

## 2023-05-13 MED ORDER — ONDANSETRON HCL 4 MG/2ML IJ SOLN
4.0000 mg | Freq: Once | INTRAMUSCULAR | Status: AC
  Administered 2023-05-13: 4 mg via INTRAVENOUS
  Filled 2023-05-13: qty 2

## 2023-05-13 MED ORDER — SODIUM CHLORIDE 0.9 % IV SOLN
3.0000 g | Freq: Once | INTRAVENOUS | Status: AC
  Administered 2023-05-13: 3 g via INTRAVENOUS
  Filled 2023-05-13: qty 8

## 2023-05-13 MED ORDER — RIVAROXABAN 10 MG PO TABS
10.0000 mg | ORAL_TABLET | Freq: Every day | ORAL | Status: DC
  Administered 2023-05-13 – 2023-05-15 (×3): 10 mg via ORAL
  Filled 2023-05-13 (×3): qty 1

## 2023-05-13 MED ORDER — IOHEXOL 350 MG/ML SOLN
75.0000 mL | Freq: Once | INTRAVENOUS | Status: AC | PRN
  Administered 2023-05-13: 75 mL via INTRAVENOUS

## 2023-05-13 MED ORDER — ONDANSETRON HCL 4 MG/2ML IJ SOLN: 4.0000 mg | Freq: Four times a day (QID) | INTRAMUSCULAR | Status: DC | PRN

## 2023-05-13 NOTE — Plan of Care (Signed)
Problem: Education: Goal: Knowledge of General Education information will improve Description: Including pain rating scale, medication(s)/side effects and non-pharmacologic comfort measures Outcome: Progressing  Pt has AMS and does not understand why he was admitted into the hospital.  Pt admitted for n/v and abdominal pain.  Problem: Health Behavior/Discharge Planning: Goal: Ability to manage health-related needs will improve Outcome: Progressing Pt is a moderate assist with his ADLs.  He is x1 assist with his ADLs.   Problem: Clinical Measurements: Goal: Will remain free from infection Outcome: Progressing S/Sx of infection monitored and assess q-shift. Pt has remains afebrile thus far.    Problem: Clinical Measurements: Goal: Respiratory complications will improve Outcome: Progressing Respiratory status monitor and assessed q-shift.  Pt on room air with O2 saturations at 97% and respirations at 18 breaths per minute.  Instructed pt to cough and deep breathe to promote respiratory status.  Pt denies SOB and DOE.     Problem: Activity: Goal: Risk for activity intolerance will decrease Outcome: Progressing Pt is a moderate assist with his ADLs.  He is x1 assist with his ADLs.   Problem: Nutrition: Goal: Adequate nutrition will be maintained Outcome: Progressing Pt is on a regular diet and has been able to tolerate it w/o s/sx of n/v, abdominal distention or pain.    Problem: Elimination: Goal: Will not experience complications related to urinary retention Outcome: Progressing Pt is incontinent of his bladder.  Pt has not endorsed c/o urinary retention, abdominal distention or pain.    Problem: Pain Managment: Goal: General experience of comfort will improve Outcome: Progressing Pt has denied pain thus far.   Problem: Safety: Goal: Ability to remain free from injury will improve Outcome: Progressing Pt has remained free from falls thus far.  Instructed pt to utilize RN call  light for assistance.  Hourly rounds performed.  Bed alarm implemented to keep pt safe from falls.  Settings activated to third most sensitive mode.  Bed in lowest position, locked with two upper side rails engaged.  Belongings and call light within reach.     Problem: Skin Integrity: Goal: Risk for impaired skin integrity will decrease Outcome: Progressing Skin integrity monitored and assessed q-shift. Instructed pt to turn/ reposition herself q2 hours to prevent further skin impairment. Tubes and drains assessed for device related pressure sores. Pt is incontinent of both his bowel and bladder.  Pt is checked q2 hours for incontinence.  Perineal care given promptly after each episode.  Wounds care performed per WOC RN and Md's orders.

## 2023-05-13 NOTE — ED Provider Notes (Addendum)
[place] EMERGENCY DEPARTMENT AT [facility] Provider Note   CSN: [id number] Arrival date & time: 05/13/23  0035     History  Chief Complaint  Patient presents with   Emesis    Todd Neal is a 77 y.o. male.  HPI  77 year old male history of bipolar disorder, hypertension, subdural hematoma, presents today complaining of episode of nausea and vomiting that occurred last night.  He denies ongoing vomiting.  He has not had anything to eat recently.  He denies fever, chills, abdominal pain, diarrhea, UTI type symptoms.  Per triage note patient had some chicken that had been left out for several hours yesterday and then had 2 episodes of vomiting.    Home Medications Prior to Admission medications   Medication Sig Start Date End Date Taking? Authorizing Provider  amLODipine (NORVASC) 5 MG tablet Take 1 tablet (5 mg total) by mouth daily. 05/24/22   Danford, Earl Lites, MD  hydrochlorothiazide (HYDRODIURIL) 25 MG tablet Take 1 tablet (25 mg total) by mouth daily. 06/04/22   Curatolo, Adam, DO  hydrochlorothiazide (HYDRODIURIL) 25 MG tablet Take 1 tablet (25 mg total) by mouth daily. 03/21/23     QUEtiapine (SEROQUEL) 25 MG tablet Take 1 tablet (25 mg total) by mouth 2 (two) times daily. Patient not taking: Reported on 05/22/2022 06/07/21   Bethann Berkshire, MD  QUEtiapine (SEROQUEL) 25 MG tablet Take 1 tablet (25 mg total) by mouth daily. 03/21/23     Tetrahydrozoline HCl (VISINE OP) Place 1 drop into both eyes daily as needed (red eyes).    [provider]      Allergies    Patient has no known allergies.    Review of Systems   Review of Systems  Physical Exam Updated Vital Signs BP (!) 177/93 (BP Location: Right Arm)   Pulse 60   Temp 98.2 F (36.8 C) (Oral)   Resp 20   SpO2 100%  Physical Exam Vitals and nursing note reviewed.  Constitutional:      Appearance: Normal appearance.  HENT:     Head: Normocephalic.     Right Ear: External ear normal.      Left Ear: External ear normal.     Nose: Nose normal.     Mouth/Throat:     Pharynx: Oropharynx is clear.  Eyes:     Extraocular Movements: Extraocular movements intact.     Pupils: Pupils are equal, round, and reactive to light.  Cardiovascular:     Rate and Rhythm: Normal rate and regular rhythm.     Pulses: Normal pulses.  Pulmonary:     Effort: Pulmonary effort is normal.     Breath sounds: Normal breath sounds.  Abdominal:     General: Abdomen is flat. Bowel sounds are normal. There is no distension.     Tenderness: There is abdominal tenderness.     Comments: Mild to moderate tenderness to palpation suprapubic to right lower quadrant area without any rebound or guarding  Musculoskeletal:        General: Normal range of motion.     Cervical back: Normal range of motion.  Skin:    General: Skin is warm.     Capillary Refill: Capillary refill takes less than 2 seconds.  Neurological:     General: No focal deficit present.     Mental Status: He is alert.  Psychiatric:        Mood and Affect: Mood normal.     ED Results / Procedures /  Treatments   Labs (all labs ordered are listed, but only abnormal results are displayed) Labs Reviewed  COMPREHENSIVE METABOLIC PANEL - Abnormal; Notable for the following components:      Result Value   Glucose, Bld 115 (*)    Albumin 3.4 (*)    All other components within normal limits  CBC  LIPASE, BLOOD  ETHANOL  URINALYSIS, ROUTINE W REFLEX MICROSCOPIC  RAPID URINE DRUG SCREEN, HOSP PERFORMED    EKG EKG Interpretation  Date/Time:  Sunday May 13 2023 07:57:40 EDT Ventricular Rate:  87 PR Interval:  164 QRS Duration: 112 QT Interval:  391 QTC Calculation: 471 R Axis:   51 Text Interpretation: Sinus rhythm Probable left atrial enlargement Left ventricular hypertrophy Non-specific ST-t changes Confirmed by Margarita Grizzle [phone]) on 05/13/2023 8:14:34 AM  Radiology DG Chest Port 1 View  Result Date: 05/13/2023 CLINICAL  DATA:  Pneumonia. EXAM: PORTABLE CHEST 1 VIEW COMPARISON:  06/04/2022 FINDINGS: Patchy airspace disease is seen in the left lung base and infrahilar right lower lung. No pulmonary edema or substantial pleural effusion. Cardiopericardial silhouette is at upper limits of normal for size. No acute bony abnormality. Telemetry leads overlie the chest. IMPRESSION: Patchy airspace disease in the left lung base and infrahilar right lower lung. Imaging features compatible with multifocal pneumonia. Electronically Signed   By: Kennith Center M.D.   On: 05/13/2023 12:35   CT ABDOMEN PELVIS W CONTRAST  Result Date: 05/13/2023 CLINICAL DATA:  Abdominal pain EXAM: CT ABDOMEN AND PELVIS WITH CONTRAST TECHNIQUE: Multidetector CT imaging of the abdomen and pelvis was performed using the standard protocol following bolus administration of intravenous contrast. RADIATION DOSE REDUCTION: This exam was performed according to the departmental dose-optimization program which includes automated exposure control, adjustment of the mA and/or kV according to patient size and/or use of iterative reconstruction technique. CONTRAST:  75mL OMNIPAQUE IOHEXOL 350 MG/ML SOLN COMPARISON:  None Available. FINDINGS: Lower chest: Heterogeneous consolidative airspace opacity throughout the left lung base and right middle lobe (series 5, image 21). Cardiomegaly. Hepatobiliary: No solid liver abnormality is seen. No gallstones, gallbladder wall thickening, or biliary dilatation. Pancreas: Unremarkable. No pancreatic ductal dilatation or surrounding inflammatory changes. Spleen: Normal in size without significant abnormality. Adrenals/Urinary Tract: Adrenal glands are unremarkable. Kidneys are normal, without renal calculi, solid lesion, or hydronephrosis. Bladder is unremarkable. Stomach/Bowel: Stomach is within normal limits. Appendix appears normal. The small bowel and colon are diffusely fluid-filled, however nonobstructed, with gas, fluid, and stool  present to the rectum. Vascular/Lymphatic: Aortic atherosclerosis. No enlarged abdominal or pelvic lymph nodes. Reproductive: No mass or other significant abnormality. Other: Large, fluid-filled right inguinal hernia (series 6, image 26). Small volume ascites. Musculoskeletal: No acute or significant osseous findings. IMPRESSION: 1. The small bowel and colon are diffusely fluid-filled, however nonobstructed, with gas, fluid, and stool present to the rectum. Findings are consistent with nonspecific infectious or inflammatory enterocolitis. 2. Large, fluid-filled right inguinal hernia. 3. Small volume ascites. 4. Heterogeneous and consolidative airspace opacity throughout the left lung base and right middle lobe, consistent with infection or aspiration. 5. Cardiomegaly. Aortic Atherosclerosis (ICD10-I70.0). Electronically Signed   By: Jearld Lesch M.D.   On: 05/13/2023 10:35    Procedures Procedures    Medications Ordered in ED Medications  Ampicillin-Sulbactam (UNASYN) 3 g in sodium chloride 0.9 % 100 mL IVPB (3 g Intravenous New Bag/Given 05/13/23 1251)  lactated ringers bolus 500 mL (0 mLs Intravenous Stopped 05/13/23 1030)  ondansetron (ZOFRAN) injection 4 mg (4 mg Intravenous Given 05/13/23  1610)  iohexol (OMNIPAQUE) 350 MG/ML injection 75 mL (75 mLs Intravenous Contrast Given 05/13/23 1014)    ED Course/ Medical Decision Making/ A&P Clinical Course as of 05/13/23 1306  Sun May 13, 2023  1045 CT reviewed interpreted significant for small bowel and colon diffusely fluid-filled but nonobstructive consistent with infectious or inflammatory enterocolitis Large fluid-filled right inguinal hernia Heterogenous consolidative airspace opacities in the left lung and right middle lobe consistent with infection or aspiration Small volume ascites [DR]    Clinical Course User Index [DR] Margarita Grizzle, MD                             Medical Decision Making Amount and/or Complexity of Data  Reviewed Labs: ordered. Radiology: ordered.  Risk Prescription drug management. Decision regarding hospitalization.   1:06 PM Patient resting in bed and appears comfortable Patient presented with nausea and vomiting. He is a poor historian and currently homeless Review of ct scan consistent with aspiration pneumonia CT reviewed with extensive pneumonia noted left lung base and the right middle lobe consistent with aspiration Chest x-Aideen Fenster ordered Will treat with antibiotics  Plan admission for further evaluation and treatment Discussed with Champ Mungo on call for internal medicine who will see for admission       Final Clinical Impression(s) / ED Diagnoses Final diagnoses:  Aspiration pneumonia of both lungs, unspecified aspiration pneumonia type, unspecified part of lung [facility])    Rx / DC Orders ED Discharge Orders     None         Margarita Grizzle, MD 05/13/23 1306    Margarita Grizzle, MD 05/13/23 1315

## 2023-05-13 NOTE — ED Notes (Signed)
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 male Room/Bed: 021C/021C  Code Status   Code Status: Full Code  Home/SNF/Other Homeless Patient oriented to: self and place Is this baseline?  unknown  Triage Complete: Triage complete  Chief Complaint Encephalopathy [G93.40]  Triage Note BIB EMS from [facility] after eating chicken that had been left out for several hours today.  2 episodes of vomiting, no diarrhea, generalized abdominal pain.    Allergies No Known Allergies  Level of Care/Admitting Diagnosis ED Disposition     ED Disposition  Admit   Condition  --   Comment  Hospital Area: [facility] [[id number]]  Level of Care: Med-Surg [16]  May place patient in observation at [facility] or [name] if equivalent level of care is available:: No  Covid Evaluation: Asymptomatic - no recent exposure (last 10 days) testing not required  Diagnosis: Encephalopathy [621308]  Admitting Physician: Mercie Eon [[id number]]  Attending Physician: Mercie Eon [[id number]]          B Medical/Surgery History Past Medical History:  Diagnosis Date   Hypertension    History reviewed. No pertinent surgical history.   A IV Location/Drains/Wounds Patient Lines/Drains/Airways Status     Active Line/Drains/Airways     Name Placement date Placement time Site Days   Peripheral IV 05/13/23 20 G Anterior;Left;Lateral Forearm 05/13/23  0901  Forearm  less than 1   External Urinary Catheter 05/22/22  1708  --  356   Wound / Incision (Open or Dehisced) 05/21/22 Laceration Face Right;Upper 05/21/22  1600  Face  357            Intake/Output Last 24 hours  Intake/Output Summary (Last 24 hours) at 05/13/2023 1440 Last data filed at 05/13/2023 1030 Gross per 24 hour  Intake 500 ml  Output --  Net 500 ml    Labs/Imaging Results for orders placed or performed during the hospital encounter of 05/13/23  (from the past 48 hour(s))  CBC     Status: None   Collection Time: 05/13/23  9:08 AM  Result Value Ref Range   WBC 9.7 4.0 - 10.5 K/uL   RBC 4.72 4.22 - 5.81 MIL/uL   Hemoglobin 13.3 13.0 - 17.0 g/dL   HCT 13.2 44.0 - 10.2 %   MCV 94.1 80.0 - 100.0 fL   MCH 28.2 26.0 - 34.0 pg   MCHC 30.0 30.0 - 36.0 g/dL   RDW 72.5 36.6 - 44.0 %   Platelets 376 150 - 400 K/uL   nRBC 0.0 0.0 - 0.2 %    Comment: Performed at [facility], [address] [place], [place] [address]  Comprehensive metabolic panel     Status: Abnormal   Collection Time: 05/13/23  9:08 AM  Result Value Ref Range   Sodium 138 135 - 145 mmol/L   Potassium 3.9 3.5 - 5.1 mmol/L   Chloride 101 98 - 111 mmol/L   CO2 25 22 - 32 mmol/L   Glucose, Bld 115 (H) 70 - 99 mg/dL    Comment: Glucose reference range applies only to samples taken after fasting for at least 8 hours.   BUN 16 8 - 23 mg/dL   Creatinine, Ser 5.95 0.61 - 1.24 mg/dL   Calcium 9.5 8.9 - 63.8 mg/dL   Total Protein 7.1 6.5 - 8.1 g/dL   Albumin 3.4 (L) 3.5 - 5.0 g/dL   AST 19  15 - 41 U/L   ALT 13 0 - 44 U/L   Alkaline Phosphatase 82 38 - 126 U/L   Total Bilirubin 0.4 0.3 - 1.2 mg/dL   GFR, Estimated >16 >10 mL/min    Comment: (NOTE) Calculated using the CKD-EPI Creatinine Equation (2021)    Anion gap 12 5 - 15    Comment: Performed at [facility], [address], [place], [place] [address]  Lipase, blood     Status: None   Collection Time: 05/13/23  9:08 AM  Result Value Ref Range   Lipase 21 11 - 51 U/L    Comment: Performed at [facility], [address] [place], [place] [address]  Ethanol     Status: None   Collection Time: 05/13/23  9:08 AM  Result Value Ref Range   Alcohol, Ethyl (B) <10 <10 mg/dL    Comment: (NOTE) Lowest detectable limit for serum alcohol is 10 mg/dL.  For medical purposes only. Performed at [facility], [address] [place], [place] [address]    DG Chest Port 1 View  Result Date:  05/13/2023 CLINICAL DATA:  Pneumonia. EXAM: PORTABLE CHEST 1 VIEW COMPARISON:  06/04/2022 FINDINGS: Patchy airspace disease is seen in the left lung base and infrahilar right lower lung. No pulmonary edema or substantial pleural effusion. Cardiopericardial silhouette is at upper limits of normal for size. No acute bony abnormality. Telemetry leads overlie the chest. IMPRESSION: Patchy airspace disease in the left lung base and infrahilar right lower lung. Imaging features compatible with multifocal pneumonia. Electronically Signed   By: Kennith Center M.D.   On: 05/13/2023 12:35   CT ABDOMEN PELVIS W CONTRAST  Result Date: 05/13/2023 CLINICAL DATA:  Abdominal pain EXAM: CT ABDOMEN AND PELVIS WITH CONTRAST TECHNIQUE: Multidetector CT imaging of the abdomen and pelvis was performed using the standard protocol following bolus administration of intravenous contrast. RADIATION DOSE REDUCTION: This exam was performed according to the departmental dose-optimization program which includes automated exposure control, adjustment of the mA and/or kV according to patient size and/or use of iterative reconstruction technique. CONTRAST:  75mL OMNIPAQUE IOHEXOL 350 MG/ML SOLN COMPARISON:  None Available. FINDINGS: Lower chest: Heterogeneous consolidative airspace opacity throughout the left lung base and right middle lobe (series 5, image 21). Cardiomegaly. Hepatobiliary: No solid liver abnormality is seen. No gallstones, gallbladder wall thickening, or biliary dilatation. Pancreas: Unremarkable. No pancreatic ductal dilatation or surrounding inflammatory changes. Spleen: Normal in size without significant abnormality. Adrenals/Urinary Tract: Adrenal glands are unremarkable. Kidneys are normal, without renal calculi, solid lesion, or hydronephrosis. Bladder is unremarkable. Stomach/Bowel: Stomach is within normal limits. Appendix appears normal. The small bowel and colon are diffusely fluid-filled, however nonobstructed, with  gas, fluid, and stool present to the rectum. Vascular/Lymphatic: Aortic atherosclerosis. No enlarged abdominal or pelvic lymph nodes. Reproductive: No mass or other significant abnormality. Other: Large, fluid-filled right inguinal hernia (series 6, image 26). Small volume ascites. Musculoskeletal: No acute or significant osseous findings. IMPRESSION: 1. The small bowel and colon are diffusely fluid-filled, however nonobstructed, with gas, fluid, and stool present to the rectum. Findings are consistent with nonspecific infectious or inflammatory enterocolitis. 2. Large, fluid-filled right inguinal hernia. 3. Small volume ascites. 4. Heterogeneous and consolidative airspace opacity throughout the left lung base and right middle lobe, consistent with infection or aspiration. 5. Cardiomegaly. Aortic Atherosclerosis (ICD10-I70.0). Electronically Signed   By: Jearld Lesch M.D.   On: 05/13/2023 10:35    Pending Labs Unresulted Labs (From admission,  onward)     Start     Ordered   05/14/23 0500  CBC  Tomorrow morning,   R        05/13/23 1419   05/14/23 0500  Basic metabolic panel  Tomorrow morning,   R        05/13/23 1419   05/13/23 1436  TSH  Once,   R        05/13/23 1436   05/13/23 1436  T4, free  Once,   R        05/13/23 1436   05/13/23 1436  Vitamin B12  Once,   R        05/13/23 1436   05/13/23 1426  Rapid urine drug screen (hospital performed)  ONCE - URGENT,   URGENT        05/13/23 1436            Vitals/Pain Today's Vitals   05/13/23 0614 05/13/23 0758 05/13/23 1253 05/13/23 1331  BP: (!) 157/100 (!) 169/97 (!) 177/93   Pulse: 96 89 60   Resp: 20 (!) 23 20   Temp: 98.6 F (37 C) 97.8 F (36.6 C) 98.2 F (36.8 C)   TempSrc: Oral Oral Oral   SpO2: 97% 98% 100%   PainSc:    Asleep    Isolation Precautions No active isolations  Medications Medications  rivaroxaban (XARELTO) tablet 10 mg (has no administration in time range)  lactated ringers bolus 500 mL (0 mLs  Intravenous Stopped 05/13/23 1030)  ondansetron (ZOFRAN) injection 4 mg (4 mg Intravenous Given 05/13/23 0907)  iohexol (OMNIPAQUE) 350 MG/ML injection 75 mL (75 mLs Intravenous Contrast Given 05/13/23 1014)  Ampicillin-Sulbactam (UNASYN) 3 g in sodium chloride 0.9 % 100 mL IVPB (0 g Intravenous Stopped 05/13/23 1331)    Mobility walks     Focused Assessments     R Recommendations: See Admitting Provider Note  Report given to:   Additional Notes:

## 2023-05-13 NOTE — ED Triage Notes (Signed)
BIB EMS from [facility] after eating chicken that had been left out for several hours today.  2 episodes of vomiting, no diarrhea, generalized abdominal pain.

## 2023-05-13 NOTE — ED Notes (Signed)
Pt gone to CT 

## 2023-05-13 NOTE — ED Notes (Signed)
Patient transported to CT 

## 2023-05-13 NOTE — H&P (Cosign Needed Addendum)
Date: 05/13/2023               Patient Name:  Todd Neal MRN: [id number]  DOB: 1945-12-13 Age / Sex: 77 y.o., male   PCP: Georgina Quint, MD         Medical Service: Internal Medicine Teaching Service         Attending Physician: Dr. Mercie Eon, MD    First Contact: Dr. Rana Snare Pager: [phone]  Second Contact: Dr. Champ Mungo Pager: [phone]       After Hours (After 5p/  First Contact Pager: [phone]  weekends / holidays): Second Contact Pager: [phone]   Chief Complaint: Vomiting  History of Present Illness: Todd Neal is a 77 y.o. M who presented to [facility] ED with CC of nausea and vomiting last night. Information obtained largely from EDP.  Apparent history of HTN, subdural hematoma, bipolar 1 disorder, not on any OP medications. He had some chicken that had been left out for several hours prior to vomiting episodes. He has not had recurrent vomiting today but did endorse one episode of diarrhea this morning after drinking coffee. He says he ate about half a sandwich here without recurrent vomiting, abdominal pain, or GI upset. He denies fever, chills, shortness of breath, chest pain, cough, trouble swallowing or choking, recent illnesses including respiratory illness.    Very difficult to obtain medical history. He is redirectable but unfocused, repeating several phrases such as 072, "clean it up," ARC, something about his sister putting a bullet. He does not appear to be responding to external stimuli.   ED Course: Mild hypertension otherwise HDS, afebrile. Labs including CBC, CMP, lipase, ethanol unremarkable. CXR showed patchy airspace disease in the L lung base and infrahilar RLL, compatible with multifocal pneumonia. CT abdomen/pelvis showed that the small bowel and colon are diffusely fluid-filled, however nonobstructed, with gas, fluid, and stool present to the rectum, consistent with nonspecific infectious or inflammatory enterocolitis; large,  fluid-filled right inguinal hernia; small volume ascites; heterogeneous and consolidative airspace opacity throughout the left lung base and right middle lobe, consistent with infection or aspiration; cardiomegaly. Given unasyn 3 g x1dose, zofran 4 mg IV x 1 dose, 500 cc LR bolus. IMTS consulted for admission for aspiration pneumonia.  Meds:  No outpatient medications have been marked as taking for the 05/13/23 encounter [facility] Encounter).   Allergies: Allergies as of 05/13/2023   (No Known Allergies)   PMH: Past Medical History:  Diagnosis Date   Hypertension     Family History: Unable to obtain.  Social History: Unhomed. Smokes about 1 pack per week, endorses alcohol and possible illicit drug use but unable to obtain more clarifying information.   Review of Systems: A complete ROS was negative except as per HPI.   Physical Exam: Blood pressure (!) 177/93, pulse 60, temperature 98.2 F (36.8 C), temperature source Oral, resp. rate 20, SpO2 100 %. General: Patient is resting comfortably in bed in no acute distress  Head: Normocephalic, atraumatic  Cardio: Regular rate and rhythm, no murmurs, rubs or gallops. 2+ pulses to bilateral upper and lower extremities  Pulmonary: Clear to ausculation bilaterally with no rales, rhonchi, and crackles  Abdomen: Soft, nontender with normoactive bowel sounds with no rebound or guarding  Skin: No rashes noted  Psych: Redirectable, at times tangential speech  Extremities: No edema appreciated to bilateral lower extremities.  2+ pedal pulses bilaterally.  No wounds appreciated to bilateral lower extremities. Neuro: No focal neurological deficits,  able to move all extremities spontaneously.   EKG: personally reviewed my interpretation is NSR with LVH and possible L atrial enlargement.  CXR: Patchy airspace disease in the left lung base and infrahilar right lower lung. Imaging features compatible with multifocal pneumonia.  CT  abdomen/pelvis: 1. The small bowel and colon are diffusely fluid-filled, however nonobstructed, with gas, fluid, and stool present to the rectum. Findings are consistent with nonspecific infectious or inflammatory enterocolitis. 2. Large, fluid-filled right inguinal hernia. 3. Small volume ascites. 4. Heterogeneous and consolidative airspace opacity throughout the left lung base and right middle lobe, consistent with infection or aspiration. 5. Cardiomegaly.  Assessment & Plan by Problem: Principal Problem:   Encephalopathy  Encephalopathy, unspecified acuity, multifactorial Patient admitted with concern for multifocal pneumonia however on interview and exam, patient seems encephalopathic however unclear cause. He does have reported history of bipolar 1 disorder, not medicated, but does not seem to be manic. Admitted in July 2022 and noted at that time to be delusional with hallucinations, in addition to speaking bizarrely. I see OV out outside facility psychiatry in 2016 for paranoid schizophrenia. He also has a history of a small subdural hematoma; no focal neurological deficits aside from mental status. He is afebrile without leukocytosis, stable on room air in no distress, but with imaging findings concerning for respiratory infection. He denies recent respiratory illness but I wonder if imaging findings could be from recent illness rather than acute infection. Denies high-aspiration risk signs. He is unhoused and the weather has been very hot this last week, combined with GI illness (vomiting and diarrhea) over last 24h; could be dehydrated but less likely main etiology. No electrolyte abnormalities or altered renal or hepatic function. Ethanol level negative on arrival. Glucose level WNL. Denies urinary retention and reports having BM so doubt bowel obstruction or impaction. Imaging from 05/2022 notes acute L cerebellar CVA, ischemic changes to L thalamus, evidence of chronic microvascular disease  which very well could also be a factor here. High risk of current CVA given history of prior CVA and history of uncontrolled hypertension. Will need to rule out acute CVA with CT head. I do suspect a psychiatric component may be contributing in addition to the several concerns above based on chart review and clinical evaluation. -CT head w/o contrast -TSH, T4, vitamin B12, UDS, magnesium, phosphorous -Consider psychiatry consult if CT head unrevealing  Multifocal pneumonia Noted on multiple imaging modalities. Patient is afebrile without leukocytosis, stable on room air, and denies symptoms at present or over last several weeks. Lung auscultation is clear. S/p unasyn 3 g x1 dose. -Will hold off on additional antibiotics  Viral gastroenteritis Timing of acute illness coincides with consumption of food that was left out for prolonged period of time and at risk for food bourne illness. Fortunately patient has not had recurrence of vomiting today and has tolerated PO intake without complication. -Zofran 4 mg PRN nausea/vomiting -Soft diet, advance as tolerated -Additional 500 cc LR bolus  Hx HTN Hypertensive in ED. Not on OP therapies.  -Will give additional 500 cc LR bolus as he looks dry with multiple recent episodes of GI loss and could have mild elevations in BP as a result -Concern for medication adherence given chart review; prefer to try and correct reversible causes prior to scheduled or PRN anti-hypertensives  Dispo: Admit patient to Observation with expected length of stay less than 2 midnights.  SignedModena Slater, DO 05/13/2023, 3:05 PM  After 5pm on weekdays and 1pm on  weekends: On Call pager: [phone]

## 2023-05-13 NOTE — ED Notes (Signed)
Pt got up to pee and was wondering around room.  Does not appear to be in any distress. PT appears A&O x4.   Pt is eating now. Will check pulse ox while ambulating once he finishes eating.

## 2023-05-13 NOTE — ED Notes (Addendum)
ED TO INPATIENT HANDOFF REPORT  ED Nurse Name and Phone #: Vivyan Biggers/ [phone]  S Name/Age/Gender Todd Neal 77 y.o. male Room/Bed: 021C/021C  Code Status   Code Status: Full Code  Home/SNF/Other Home Patient oriented to: self Is this baseline? Yes   Triage Complete: Triage complete  Chief Complaint Encephalopathy [G93.40]  Triage Note BIB EMS from [facility] after eating chicken that had been left out for several hours today.  2 episodes of vomiting, no diarrhea, generalized abdominal pain.    Allergies No Known Allergies  Level of Care/Admitting Diagnosis ED Disposition     ED Disposition  Admit   Condition  --   Comment  Hospital Area: [facility] [[id number]]  Level of Care: Med-Surg [16]  May place patient in observation at [facility] or [name] if equivalent level of care is available:: No  Covid Evaluation: Asymptomatic - no recent exposure (last 10 days) testing not required  Diagnosis: Encephalopathy [433295]  Admitting Physician: Mercie Eon [[id number]]  Attending Physician: Mercie Eon [[id number]]          B Medical/Surgery History Past Medical History:  Diagnosis Date   Hypertension    History reviewed. No pertinent surgical history.   A IV Location/Drains/Wounds Patient Lines/Drains/Airways Status     Active Line/Drains/Airways     Name Placement date Placement time Site Days   Peripheral IV 05/13/23 20 G Anterior;Left;Lateral Forearm 05/13/23  0901  Forearm  less than 1   External Urinary Catheter 05/22/22  1708  --  356   Wound / Incision (Open or Dehisced) 05/21/22 Laceration Face Right;Upper 05/21/22  1600  Face  357            Intake/Output Last 24 hours  Intake/Output Summary (Last 24 hours) at 05/13/2023 1619 Last data filed at 05/13/2023 1030 Gross per 24 hour  Intake 500 ml  Output --  Net 500 ml    Labs/Imaging Results for orders placed or performed during the hospital encounter of 05/13/23 (from the  past 48 hour(s))  CBC     Status: None   Collection Time: 05/13/23  9:08 AM  Result Value Ref Range   WBC 9.7 4.0 - 10.5 K/uL   RBC 4.72 4.22 - 5.81 MIL/uL   Hemoglobin 13.3 13.0 - 17.0 g/dL   HCT 10.9 32.3 - 55.7 %   MCV 94.1 80.0 - 100.0 fL   MCH 28.2 26.0 - 34.0 pg   MCHC 30.0 30.0 - 36.0 g/dL   RDW 32.2 02.5 - 42.7 %   Platelets 376 150 - 400 K/uL   nRBC 0.0 0.0 - 0.2 %    Comment: Performed at [facility], [address] [place], [place] [address]  Comprehensive metabolic panel     Status: Abnormal   Collection Time: 05/13/23  9:08 AM  Result Value Ref Range   Sodium 138 135 - 145 mmol/L   Potassium 3.9 3.5 - 5.1 mmol/L   Chloride 101 98 - 111 mmol/L   CO2 25 22 - 32 mmol/L   Glucose, Bld 115 (H) 70 - 99 mg/dL    Comment: Glucose reference range applies only to samples taken after fasting for at least 8 hours.   BUN 16 8 - 23 mg/dL   Creatinine, Ser 6.28 0.61 - 1.24 mg/dL   Calcium 9.5 8.9 - 31.5 mg/dL   Total Protein 7.1 6.5 - 8.1 g/dL   Albumin 3.4 (L) 3.5 - 5.0 g/dL   AST 19 15 -  41 U/L   ALT 13 0 - 44 U/L   Alkaline Phosphatase 82 38 - 126 U/L   Total Bilirubin 0.4 0.3 - 1.2 mg/dL   GFR, Estimated >16 >10 mL/min    Comment: (NOTE) Calculated using the CKD-EPI Creatinine Equation (2021)    Anion gap 12 5 - 15    Comment: Performed at [facility], [address], [place], [place] [address]  Lipase, blood     Status: None   Collection Time: 05/13/23  9:08 AM  Result Value Ref Range   Lipase 21 11 - 51 U/L    Comment: Performed at [facility], [address] [place], [place] [address]  Ethanol     Status: None   Collection Time: 05/13/23  9:08 AM  Result Value Ref Range   Alcohol, Ethyl (B) <10 <10 mg/dL    Comment: (NOTE) Lowest detectable limit for serum alcohol is 10 mg/dL.  For medical purposes only. Performed at [facility], [address], [place], [place] [address]    CT HEAD WO CONTRAST ( )  Result Date:  05/13/2023 CLINICAL DATA:  Mental status change, unknown cause EXAM: CT HEAD WITHOUT CONTRAST TECHNIQUE: Contiguous axial images were obtained from the base of the skull through the vertex without intravenous contrast. RADIATION DOSE REDUCTION: This exam was performed according to the departmental dose-optimization program which includes automated exposure control, adjustment of the mA and/or kV according to patient size and/or use of iterative reconstruction technique. COMPARISON:  05/22/2022 FINDINGS: Brain: No evidence of acute infarction, hemorrhage, hydrocephalus, extra-axial collection or mass lesion/mass effect. Scattered low-density changes within the periventricular and subcortical white matter most compatible with chronic microvascular ischemic change. Mild diffuse cerebral volume loss. Vascular: No hyperdense vessel or unexpected calcification. Skull: Normal. Negative for fracture or focal lesion. Sinuses/Orbits: No acute finding. Other: None. IMPRESSION: No acute intracranial abnormality. Electronically Signed   By: Duanne Guess D.O.   On: 05/13/2023 15:25   DG Chest Port 1 View  Result Date: 05/13/2023 CLINICAL DATA:  Pneumonia. EXAM: PORTABLE CHEST 1 VIEW COMPARISON:  06/04/2022 FINDINGS: Patchy airspace disease is seen in the left lung base and infrahilar right lower lung. No pulmonary edema or substantial pleural effusion. Cardiopericardial silhouette is at upper limits of normal for size. No acute bony abnormality. Telemetry leads overlie the chest. IMPRESSION: Patchy airspace disease in the left lung base and infrahilar right lower lung. Imaging features compatible with multifocal pneumonia. Electronically Signed   By: Kennith Center M.D.   On: 05/13/2023 12:35   CT ABDOMEN PELVIS W CONTRAST  Result Date: 05/13/2023 CLINICAL DATA:  Abdominal pain EXAM: CT ABDOMEN AND PELVIS WITH CONTRAST TECHNIQUE: Multidetector CT imaging of the abdomen and pelvis was performed using the standard  protocol following bolus administration of intravenous contrast. RADIATION DOSE REDUCTION: This exam was performed according to the departmental dose-optimization program which includes automated exposure control, adjustment of the mA and/or kV according to patient size and/or use of iterative reconstruction technique. CONTRAST:  75mL OMNIPAQUE IOHEXOL 350 MG/ML SOLN COMPARISON:  None Available. FINDINGS: Lower chest: Heterogeneous consolidative airspace opacity throughout the left lung base and right middle lobe (series 5, image 21). Cardiomegaly. Hepatobiliary: No solid liver abnormality is seen. No gallstones, gallbladder wall thickening, or biliary dilatation. Pancreas: Unremarkable. No pancreatic ductal dilatation or surrounding inflammatory changes. Spleen: Normal in size without significant abnormality. Adrenals/Urinary Tract: Adrenal glands are unremarkable. Kidneys are normal, without renal calculi, solid lesion, or hydronephrosis. Bladder is  unremarkable. Stomach/Bowel: Stomach is within normal limits. Appendix appears normal. The small bowel and colon are diffusely fluid-filled, however nonobstructed, with gas, fluid, and stool present to the rectum. Vascular/Lymphatic: Aortic atherosclerosis. No enlarged abdominal or pelvic lymph nodes. Reproductive: No mass or other significant abnormality. Other: Large, fluid-filled right inguinal hernia (series 6, image 26). Small volume ascites. Musculoskeletal: No acute or significant osseous findings. IMPRESSION: 1. The small bowel and colon are diffusely fluid-filled, however nonobstructed, with gas, fluid, and stool present to the rectum. Findings are consistent with nonspecific infectious or inflammatory enterocolitis. 2. Large, fluid-filled right inguinal hernia. 3. Small volume ascites. 4. Heterogeneous and consolidative airspace opacity throughout the left lung base and right middle lobe, consistent with infection or aspiration. 5. Cardiomegaly. Aortic  Atherosclerosis (ICD10-I70.0). Electronically Signed   By: Jearld Lesch M.D.   On: 05/13/2023 10:35    Pending Labs Unresulted Labs (From admission, onward)     Start     Ordered   05/14/23 0500  CBC  Tomorrow morning,   R        05/13/23 1419   05/14/23 0500  Basic metabolic panel  Tomorrow morning,   R        05/13/23 1419   05/13/23 1441  Magnesium  Add-on,   AD        05/13/23 1441   05/13/23 1441  Phosphorus  Add-on,   AD        05/13/23 1441   05/13/23 1436  TSH  Once,   R        05/13/23 1436   05/13/23 1436  T4, free  Once,   R        05/13/23 1436   05/13/23 1436  Vitamin B12  Once,   R        05/13/23 1436   05/13/23 1426  Rapid urine drug screen (hospital performed)  ONCE - URGENT,   URGENT        05/13/23 1436            Vitals/Pain Today's Vitals   05/13/23 0614 05/13/23 0758 05/13/23 1253 05/13/23 1331  BP: (!) 157/100 (!) 169/97 (!) 177/93   Pulse: 96 89 60   Resp: 20 (!) 23 20   Temp: 98.6 F (37 C) 97.8 F (36.6 C) 98.2 F (36.8 C)   TempSrc: Oral Oral Oral   SpO2: 97% 98% 100%   PainSc:    Asleep    Isolation Precautions No active isolations  Medications Medications  rivaroxaban (XARELTO) tablet 10 mg (10 mg Oral Given 05/13/23 1546)  ondansetron (ZOFRAN) injection 4 mg (has no administration in time range)  lactated ringers bolus 500 mL (0 mLs Intravenous Stopped 05/13/23 1030)  ondansetron (ZOFRAN) injection 4 mg (4 mg Intravenous Given 05/13/23 0907)  iohexol (OMNIPAQUE) 350 MG/ML injection 75 mL (75 mLs Intravenous Contrast Given 05/13/23 1014)  Ampicillin-Sulbactam (UNASYN) 3 g in sodium chloride 0.9 % 100 mL IVPB (0 g Intravenous Stopped 05/13/23 1331)  lactated ringers bolus 500 mL (500 mLs Intravenous New Bag/Given 05/13/23 1550)    Mobility Pt ambulates w/ assistance      Focused Assessments     R Recommendations: See Admitting Provider Note  Report given to:   Additional Notes: Pt came in for N/V and abd pain. He has 20 G  in the L FA. He got Unasyn, 1000 of LR, and zofran. Pt is bi-polar and goes in and out when talking to him.

## 2023-05-13 NOTE — Hospital Course (Signed)
Todd Neal is a 77 y.o. with a pertinent PMH of HTN, documented bipolar 1 disorder and schizophrenia who presents with nausea and vomiting and admitted for encephalopathy.  Concern for acute encephalopathy, ruled out Hx of bipolar disorder and schizophrenia Admitted due to concern for encephalopathy and disorganized thoughts. Overall his medical workup for AMS unremarkable. No acute findings on head CT. No electrolyte derangements and normal B12 and TSH. Vitals remains stable and he is afebrile. Likely altered mental status in setting of his untreated psychiatric conditions. Does not appear manic. Inpatient psychiatry consulted but does not believe he is in acute psychosis. At times he has disorganized thought and tangential speech which is pronounced when discussing with physicians. On day of discharge, able to have conversation with less tangential speech. He was started on Risperdal 0.5 mg BID which will be continued at discharge.  He will need referral to outpatient psychiatry at his PCP follow-up.   Imaging findings for multifocal pneumonia  Noted on multiple imaging modalities for patchy airspace disease. Patient is afebrile without leukocytosis, stable on room air, and denies any respiratory symptoms at present or over last several weeks. Lung exam unremarkable. Received empiric Unasyn 3 g once in ED. We elected not continuing antibiotics at this time.    Viral gastroenteritis, resolved Timing of acute illness with GI symptoms coincides with consumption of food that was left out for prolonged period of time and at risk for food bourne illness. Symptoms have resolved and tolerating po intake.     HTN BP elevated during hospital course. Was not taking any antihypertensives prior to admission. He was prescribed amlodipine and hydrochlorothiazide previously. Will have patient follow up with PCP regarding initiating antihypertensives.

## 2023-05-13 NOTE — Progress Notes (Signed)
Pt admitted from ED for n/v, abdominal pain and PNA.  Pt was transported via gurney accompanied by transporter.  VS obtained, BP was elevated and pt hypertensive which is not a change from the ED.  Reviewed POC with pt and oriented him to his new surroundings.  Answered any further questions.  Pt had any further questions.  Instructed pt to utilize RN call light for assistance.  Bed in lowest position, locked with two upper side rails engaged.  Belongings and call light within reach.

## 2023-05-14 DIAGNOSIS — G9340 Encephalopathy, unspecified: Secondary | ICD-10-CM

## 2023-05-14 DIAGNOSIS — F29 Unspecified psychosis not due to a substance or known physiological condition: Secondary | ICD-10-CM

## 2023-05-14 DIAGNOSIS — A084 Viral intestinal infection, unspecified: Secondary | ICD-10-CM

## 2023-05-14 LAB — CBC
HCT: 36.5 % — ABNORMAL LOW (ref 39.0–52.0)
Hemoglobin: 11.2 g/dL — ABNORMAL LOW (ref 13.0–17.0)
MCH: 28 pg (ref 26.0–34.0)
MCHC: 30.7 g/dL (ref 30.0–36.0)
MCV: 91.3 fL (ref 80.0–100.0)
Platelets: 337 10*3/uL (ref 150–400)
RBC: 4 MIL/uL — ABNORMAL LOW (ref 4.22–5.81)
RDW: 14.5 % (ref 11.5–15.5)
WBC: 7.1 10*3/uL (ref 4.0–10.5)
nRBC: 0 % (ref 0.0–0.2)

## 2023-05-14 LAB — BASIC METABOLIC PANEL
Anion gap: 10 (ref 5–15)
BUN: 12 mg/dL (ref 8–23)
CO2: 26 mmol/L (ref 22–32)
Calcium: 8.8 mg/dL — ABNORMAL LOW (ref 8.9–10.3)
Chloride: 102 mmol/L (ref 98–111)
Creatinine, Ser: 0.87 mg/dL (ref 0.61–1.24)
GFR, Estimated: >60 mL/min (ref >60)
Glucose, Bld: 95 mg/dL (ref 70–99)
Potassium: 3.8 mmol/L (ref 3.5–5.1)
Sodium: 138 mmol/L (ref 135–145)

## 2023-05-14 LAB — MAGNESIUM: Magnesium: 1.7 mg/dL (ref 1.7–2.4)

## 2023-05-14 LAB — PHOSPHORUS: Phosphorus: 2.8 mg/dL (ref 2.5–4.6)

## 2023-05-14 LAB — T4, FREE: Free T4: 1.12 ng/dL (ref 0.61–1.12)

## 2023-05-14 MED ORDER — RISPERIDONE 0.5 MG PO TABS
0.5000 mg | ORAL_TABLET | Freq: Two times a day (BID) | ORAL | Status: DC
  Administered 2023-05-14 – 2023-05-15 (×3): 0.5 mg via ORAL
  Filled 2023-05-14 (×5): qty 1

## 2023-05-14 NOTE — Plan of Care (Signed)
Problem: Education: Goal: Knowledge of General Education information will improve Description: Including pain rating scale, medication(s)/side effects and non-pharmacologic comfort measures Outcome: Progressing Problem: Education: Pt does not have an understanding into why he was admitted into the hospital.  He is altered and A/O to self and place.  Explained to pt that he was admitted into the hospital for n/v and abdominal pain.   Problem: Health Behavior/Discharge Planning: Goal: Ability to manage health-related needs will improve Outcome: Progressing Pt is a moderate assist with his ADLs.  He is x1 assist OOB to the [facility] or BR.    Problem: Clinical Measurements: Goal: Will remain free from infection Outcome: Progressing S/Sx of infection monitored and assess q-shift.  Pt has remains afebrile thus far.     Problem: Clinical Measurements: Goal: Respiratory complications will improve Outcome: Progressing Respiratory status monitor and assessed q-shift.  Pt on room air with O2 saturations at 99% and respirations at 18 breaths per minute.  Instructed pt to cough and deep breathe to promote respiratory status.  Pt denies SOB and DOE.     Problem: Activity: Goal: Risk for activity intolerance will decrease Outcome: Progressing Pt is a moderate assist with his ADLs.  He is x1 assist OOB to the [facility] or BR.    Problem: Nutrition: Goal: Adequate nutrition will be maintained Outcome: Not Progressing Pt is on a regular diet and has been able to tolerate it w/o c/o of n/v or abdominal pain.    Problem: Elimination: Goal: Will not experience complications related to urinary retention Outcome: Progressing Pt is intermittently incontinent of his bladder.  He has not endorsed c/o urinary retention, abdominal distention or pain.    Problem: Pain Managment: Goal: General experience of comfort will improve Outcome: Progressing Pt has denied pain thus far.   Problem: Safety: Goal: Ability to  remain free from injury will improve Outcome: Progressing Pt has remained free from falls thus far.  Instructed pt to utilize RN call light for assistance.  Hourly rounds performed.  Bed alarm implemented to keep pt safe from falls.  Settings activated to third most sensitive mode.  Bed in lowest position, locked with two upper side rails engaged.  Belongings and call light within reach.     Problem: Skin Integrity: Goal: Risk for impaired skin integrity will decrease Outcome: Progressing Skin integrity monitored and assessed q-shift. Instructed pt to turn/ reposition himself q2 hours to prevent further skin impairment. Tubes and drains assessed for device related pressure sores. Pt is intermittently incontinent of both his bowel and bladder.  He is checked q2 hours for incontinent episodes.  Perineal care given promptly after each episode.  Wounds care performed per WOC RN and Md's orders.

## 2023-05-14 NOTE — Progress Notes (Signed)
HD#0 Subjective:   Summary: Todd Neal is a 77 y.o. male with PMH of HTN, documented bipolar 1 disorder and schizophrenia who presents with nausea and vomiting and admitted for encephalopathy.  Overnight Events: none  He states he is "Black and white" multiple times during encounter. Able to state his full name. He states he is at [facility]. He notes that the chicken did not sit with him right. He denies any pain. He reports eating breakfast this morning. He is able to follow some instructions. He states he was at the [facility]. He is unable to hold a complete conversation and very tangential but can be redirectable.  Objective:  Vital signs in last 24 hours: Vitals:   05/13/23 1730 05/13/23 1849 05/13/23 2023 05/14/23 0620  BP: (!) 173/99  (!) 162/90 (!) 159/111  Pulse: 67  68 80  Resp: 18  15 19   Temp: 98 F (36.7 C)  98.4 F (36.9 C) 98.1 F (36.7 C)  TempSrc: Oral     SpO2: 97%  100% 95%  Weight:  77.6 kg    Height:  5\' 11"  (1.803 m)     Supplemental O2: Room Air SpO2: 95 %   Physical Exam:  Constitutional: Patient resting comfortably in bed, in no acute distress Cardiovascular: Regular rate and rhythm Pulmonary/Chest: Normal work of breathing on room air, lungs clear to auscultation bilaterally Abdominal: Bowel sounds present, soft, nontender, nondistended MSK: normal bulk and tone Neurological: Alert and oriented to self but stated at [facility] and did not know year or situation Skin: warm and dry Psych: Tangential speech, disorganized thought  Filed Weights   05/13/23 1849  Weight: 77.6 kg     Intake/Output Summary (Last 24 hours) at 05/14/2023 0804 Last data filed at 05/13/2023 1814 Gross per 24 hour  Intake 1240.37 ml  Output --  Net 1240.37 ml   Net IO Since Admission: 1,240.37 mL [05/14/23 0804]  Pertinent Labs:    Latest Ref Rng & Units 05/14/2023    3:50 AM 05/13/2023    9:08 AM 06/04/2022    2:57 PM  CBC  WBC 4.0 - 10.5 K/uL 7.1  9.7  7.3    Hemoglobin 13.0 - 17.0 g/dL 29.5  62.1  30.8   Hematocrit 39.0 - 52.0 % 36.5  44.4  41.0   Platelets 150 - 400 K/uL 337  376  320        Latest Ref Rng & Units 05/14/2023    3:50 AM 05/13/2023    9:08 AM 06/04/2022    2:57 PM  CMP  Glucose 70 - 99 mg/dL 95  657  90   BUN 8 - 23 mg/dL 12  16  19    Creatinine 0.61 - 1.24 mg/dL 8.46  9.62  9.52   Sodium 135 - 145 mmol/L 138  138  143   Potassium 3.5 - 5.1 mmol/L 3.8  3.9  4.3   Chloride 98 - 111 mmol/L 102  101  111   CO2 22 - 32 mmol/L 26  25  28    Calcium 8.9 - 10.3 mg/dL 8.8  9.5  9.6   Total Protein 6.5 - 8.1 g/dL  7.1    Total Bilirubin 0.3 - 1.2 mg/dL  0.4    Alkaline Phos 38 - 126 U/L  82    AST 15 - 41 U/L  19    ALT 0 - 44 U/L  13      Imaging: CT HEAD WO  CONTRAST ( )  Result Date: 05/13/2023 CLINICAL DATA:  Mental status change, unknown cause EXAM: CT HEAD WITHOUT CONTRAST TECHNIQUE: Contiguous axial images were obtained from the base of the skull through the vertex without intravenous contrast. RADIATION DOSE REDUCTION: This exam was performed according to the departmental dose-optimization program which includes automated exposure control, adjustment of the mA and/or kV according to patient size and/or use of iterative reconstruction technique. COMPARISON:  05/22/2022 FINDINGS: Brain: No evidence of acute infarction, hemorrhage, hydrocephalus, extra-axial collection or mass lesion/mass effect. Scattered low-density changes within the periventricular and subcortical white matter most compatible with chronic microvascular ischemic change. Mild diffuse cerebral volume loss. Vascular: No hyperdense vessel or unexpected calcification. Skull: Normal. Negative for fracture or focal lesion. Sinuses/Orbits: No acute finding. Other: None. IMPRESSION: No acute intracranial abnormality. Electronically Signed   By: Duanne Guess D.O.   On: 05/13/2023 15:25   DG Chest Port 1 View  Result Date: 05/13/2023 CLINICAL DATA:  Pneumonia.  EXAM: PORTABLE CHEST 1 VIEW COMPARISON:  06/04/2022 FINDINGS: Patchy airspace disease is seen in the left lung base and infrahilar right lower lung. No pulmonary edema or substantial pleural effusion. Cardiopericardial silhouette is at upper limits of normal for size. No acute bony abnormality. Telemetry leads overlie the chest. IMPRESSION: Patchy airspace disease in the left lung base and infrahilar right lower lung. Imaging features compatible with multifocal pneumonia. Electronically Signed   By: Kennith Center M.D.   On: 05/13/2023 12:35   CT ABDOMEN PELVIS W CONTRAST  Result Date: 05/13/2023 CLINICAL DATA:  Abdominal pain EXAM: CT ABDOMEN AND PELVIS WITH CONTRAST TECHNIQUE: Multidetector CT imaging of the abdomen and pelvis was performed using the standard protocol following bolus administration of intravenous contrast. RADIATION DOSE REDUCTION: This exam was performed according to the departmental dose-optimization program which includes automated exposure control, adjustment of the mA and/or kV according to patient size and/or use of iterative reconstruction technique. CONTRAST:  75mL OMNIPAQUE IOHEXOL 350 MG/ML SOLN COMPARISON:  None Available. FINDINGS: Lower chest: Heterogeneous consolidative airspace opacity throughout the left lung base and right middle lobe (series 5, image 21). Cardiomegaly. Hepatobiliary: No solid liver abnormality is seen. No gallstones, gallbladder wall thickening, or biliary dilatation. Pancreas: Unremarkable. No pancreatic ductal dilatation or surrounding inflammatory changes. Spleen: Normal in size without significant abnormality. Adrenals/Urinary Tract: Adrenal glands are unremarkable. Kidneys are normal, without renal calculi, solid lesion, or hydronephrosis. Bladder is unremarkable. Stomach/Bowel: Stomach is within normal limits. Appendix appears normal. The small bowel and colon are diffusely fluid-filled, however nonobstructed, with gas, fluid, and stool present to the  rectum. Vascular/Lymphatic: Aortic atherosclerosis. No enlarged abdominal or pelvic lymph nodes. Reproductive: No mass or other significant abnormality. Other: Large, fluid-filled right inguinal hernia (series 6, image 26). Small volume ascites. Musculoskeletal: No acute or significant osseous findings. IMPRESSION: 1. The small bowel and colon are diffusely fluid-filled, however nonobstructed, with gas, fluid, and stool present to the rectum. Findings are consistent with nonspecific infectious or inflammatory enterocolitis. 2. Large, fluid-filled right inguinal hernia. 3. Small volume ascites. 4. Heterogeneous and consolidative airspace opacity throughout the left lung base and right middle lobe, consistent with infection or aspiration. 5. Cardiomegaly. Aortic Atherosclerosis (ICD10-I70.0). Electronically Signed   By: Jearld Lesch M.D.   On: 05/13/2023 10:35    Assessment/Plan:   Principal Problem:   Encephalopathy   Patient Summary: Todd Neal is a 77 y.o. with a pertinent PMH of HTN, documented bipolar 1 disorder and schizophrenia who presents with nausea and vomiting and admitted  for encephalopathy.  #Concern for acute psychosis #Hx of bipolar disorder and schizophrenia  Admitted initially for concern of encephalopathy.  Patient with continued disorganized thoughts and tangential speech stating "black and white" multiple times throughout encounter. Chart review notes history of bipolar disorder and paranoid schizophrenia. He is able to state his full name but states he is at [facility] and unclear of year or situation.  Head CT without any acute findings.  No metabolic derangements.  Vitals remained stable.  Consulted inpatient psychiatry for further evaluation for concern of acute psychosis.  He was not on any medications including antipsychotics prior to arrival.  Unable to get collateral information from family or [facility] at this time. -Appreciate psychiatry assistance -Continue trying to  reach out to family or [facility] for collateral -Start Risperdal 0.5 mg BID -Lipid panel  #Abnormal chest x-ray findings CXR showed patchy airspace at left lung base and infrahilar right lower lung, suggestive of multifocal pneumonia.  However patient is afebrile without leukocytosis, satting well on room air and denies any respiratory symptoms.  Lung exam unremarkable.  Will not continue antibiotics at this time.  #Viral gastroenteritis, resolved Acute onset of nausea, vomiting and diarrhea after consumption of food that was left out.  His symptoms have resolved and tolerating p.o. intake today.  No acute findings on abdominal exam today.  #History of hypertension He is mildly hypertensive today.  Patient is not on any medications including antihypertensives.  He was previously prescribed antihypertensives but unclear of medication adherence.   Diet: Normal IVF: None,None VTE: DOAC Code: Full Family Update: Unable to reach any family members by phone at this time  Dispo: Anticipated discharge to  pending  psychiatry evaluation.  Rana Snare, DO Internal Medicine Resident PGY-1 Pager: [phone] Please contact the on call pager after 5 pm and on weekends at [phone].

## 2023-05-14 NOTE — TOC Initial Note (Signed)
Transition of Care [facility]) - Initial/Assessment Note    Patient Details  Name: Todd Neal MRN: [id number] Date of Birth: [birth year]  [facility] CM/SW Contact:    Harriet Masson, RN Phone Number: 05/14/2023, 12:29 PM  Clinical Narrative:                 Patient is confused and all 3 contact numbers are no longer in service.  Nurse stated patient is homeless and confused currently.  TOC will continue to follow.       Patient Goals and CMS Choice            Expected Discharge Plan and Services                                              Prior Living Arrangements/Services                       Activities of Daily Living Home Assistive Devices/Equipment: None ADL Screening (condition at time of admission) Patient's cognitive ability adequate to safely complete daily activities?: No Is the patient deaf or have difficulty hearing?: No Does the patient have difficulty seeing, even when wearing glasses/contacts?: No Does the patient have difficulty concentrating, remembering, or making decisions?: Yes Patient able to express need for assistance with ADLs?: Yes Does the patient have difficulty dressing or bathing?: Yes Independently performs ADLs?: No Communication: Needs assistance Is this a change from baseline?: Pre-admission baseline Dressing (OT): Needs assistance Is this a change from baseline?: Pre-admission baseline Grooming: Needs assistance Is this a change from baseline?: Pre-admission baseline Feeding: Independent with device (comment) Bathing: Needs assistance Is this a change from baseline?: Pre-admission baseline Toileting: Needs assistance Is this a change from baseline?: Pre-admission baseline In/Out Bed: Needs assistance Is this a change from baseline?: Pre-admission baseline Walks in Home: Needs assistance Is this a change from baseline?: Pre-admission baseline Does the patient have difficulty walking or  climbing stairs?: Yes Weakness of Legs: Both Weakness of Arms/Hands: None  Permission Sought/Granted                  Emotional Assessment              Admission diagnosis:  Encephalopathy [G93.40] Aspiration pneumonia of both lungs, unspecified aspiration pneumonia type, unspecified part of lung (HCC) [J69.0] Patient Active Problem List   Diagnosis Date Noted   Encephalopathy 05/13/2023   Hypertensive urgency 05/22/2022   Subdural hematoma (HCC) 05/21/2022   Essential hypertension 08/01/2017   Colon cancer screening 08/01/2017   Bipolar I disorder (HCC) 02/18/2015   PCP:  Georgina Quint, MD Pharmacy:   [id number] [place], [place] - [address] AT [facility] OF [facility] & [address] [place] [address] Phone: [phone] Fax: [phone]     Social Determinants of Health (SDOH) Social History: SDOH Screenings   Food Insecurity: Patient Unable To Answer (05/13/2023)  Housing: Patient Unable To Answer (05/13/2023)  Transportation Needs: Unmet Transportation Needs (05/13/2023)  Utilities: Patient Unable To Answer (05/13/2023)  Depression (PHQ2-9): Low Risk  (08/23/2021)  Recent Concern: Depression (PHQ2-9) - Medium Risk (06/01/2021)  Tobacco Use: High Risk (05/13/2023)   SDOH Interventions:     Readmission Risk Interventions     No data to display

## 2023-05-14 NOTE — Consult Note (Signed)
Patient seen and assessed briefly at the recommendations of Attending psychiatrist. Upon entry writer introduced self by name (intentionally left out department). Patient removed covers from head and turned to speak with this provider, he also grabbed his remote to turn down the volume. Patient tells me " I am having a good day. I am doing better. That chicken messed me up. The chicken wasn't cooked all the way. I was sick bad and throwing up, but I am eating and I feel better now that I am eating. Patient further reports staying at the [facility] and has a house on [address] but does better when at the [facility]. He states when at the [facility] he can get his medications for his blood pressure. At which point I inquired about his psychiatric medications. Soon after patient began to derail, became disorganized and displaying clang association. He asked if I had a "white claw" and began to point at the tv. Writer turned around to use the computer in the room, in which patient replies " I haven't been able to get that thing to work since I been hereAir cabin crew then inquired about patients success with LAI, and he pulls out both of this arms " I already got two shots." Referring to the IVs in his forearms.   Patient was able to engage in a linear conversation with appropriate answers until he recognized this provider was with psychiatry. Upon leaving the room patient states " where are you going? Are you coming back?" Provider responded no in which he returned to nonsensical speech , clang words and rapping. After completely closing the door, patient unmuted the TV and talking stopped. Patient current symptoms presentation not consistent with acute psychosis or mania at this time. It appears some of his clinical symptoms (to include) intermittent episodes of psychosis, clang association, disorganized thought processes seems to demonstrate patterns of behavior consistent with intentionality and possible secondary gain(attention).  His  mood was pleasant and euthymic with congruent affect.  At one point in time he did look in the corner and began to start talking, prior to returning back to conversation and laughing about long-acting medications.  Patient also may some very odd statements, such as asking provider to get white claw and Mr. Jillyn Hidden.  Recommend continuing ongoing treatment, without stigmatizing patient.  Patient may benefit from ongoing antipsychotic use versus long-acting injectable to help for overall maintenance and reduction of schizophrenia symptoms.  Chart review further shows patient has a history of similar presentation to include bizarre behavior, hallucinations, delusional, responding to internal stimuli; will clear with/without use of low-dose antipsychotic (Seroquel 25 mg p.o. twice daily).  During this admission patient has not displayed any disruptive behaviors, and has been compliant with most medications.  Please see note from attending for final disposition.

## 2023-05-14 NOTE — Consult Note (Addendum)
APS came by to speak on pt case.  Her name is Todd Neal.  Please call her at [phone] or [phone] with any concerns.

## 2023-05-14 NOTE — Care Management Obs Status (Signed)
MEDICARE OBSERVATION STATUS NOTIFICATION   Patient Details  Name: Todd Neal MRN: [id number] Date of Birth: 1946-10-21   Medicare Observation Status Notification Given:  Yes    Harriet Masson, RN 05/14/2023, 12:29 PM

## 2023-05-14 NOTE — Consult Note (Signed)
[facility] Psychiatry New Face-to-Face Psychiatric Evaluation   Service Date: May 14, 2023 LOS:  LOS: 0 days    Assessment  Todd Neal is a 77 y.o. male admitted medically for 05/13/2023 12:35 AM for GI distress after eating bad chicken. He carries the psychiatric diagnoses of schizophrneia and cocaine use and has a past medical history of  hypertension. Psychiatry was consulted for psychosis by Dr. Sherrilee Gilles.    His current presentation of disorganization of thought is most consistent with untreated schizophrenia. He seems to have some thought disorganization at baseline, however does magnify these symptoms when he knows he is being seen by psychiatryt team or his mental health is discussed. He has no current outpatient psych medications; historically has had a fair response to antipsychotics per EMR. On my initial assessment, pt appeared to be quite psychotic (paranoid, intermittently responding to internal stimuli, perseverative, etc). I had him re-evaluated by a nurse practitioner on the team, and (while he was mildly disorganized earlier in assessment) he did not display overt psychotic symptoms until she announced that she was from psychiatry. Please see plan below for detailed recommendations.   Diagnoses:  Active Hospital problems: Principal Problem:   Encephalopathy Active Problems:   Psychosis (HCC)   Viral gastroenteritis     Plan  ## Safety and Observation Level:  - Based on my clinical evaluation, I estimate the patient to be at low risk of self harm in the current setting - At this time, we recommend a routine level of observation. This decision is based on my review of the chart including patient's history and current presentation, interview of the patient, mental status examination, and consideration of suicide risk including evaluating suicidal ideation, plan, intent, suicidal or self-harm behaviors, risk factors, and protective factors. This judgment is based on  our ability to directly address suicide risk, implement suicide prevention strategies and develop a safety plan while the patient is in the clinical setting. Please contact our team if there is a concern that risk level has changed.   ## Medications:  -- s risperidone 0.5 mg BID   ## Medical Decision Making Capacity:  Not formally assessed  ## Further Work-up:  -- none currently     -- most recent EKG on 6/23 had QtC of 471 -- Pertinent labwork reviewed earlier this admission includes: TSH wnl, B12 wnl  ## Disposition:  -- per primary, refer to outpt psychiatry  Thank you for this consult request. Recommendations have been communicated to the primary team.  We will sign off at this time.   [name] A [name]   New history  Relevant Aspects of Hospital Course:  Admitted on 05/13/2023 for gastroenteritis.  Patient Report:  I saw patient briefly x2 today as he seemed unable to tolerate a longer interview and gestured for me to leave the room (he did remember me the second time). I did introtduce myself as from psychiatry.  He is oriented at least to self and to situation. He has trouble answering all other questions due to significant thought disorganization with poverty of content. He repeated the phrase "black and white" several times. When I asked about suicidal ideation, he endorsed HI to the chicken (he is admitted after eating bad chicken). As I was leaving the room he stated that I was the devil. I had another provider from the psych team assess due to the difficulty with interview; her free-form note below, emphasis from this author:   NP assessment Patient seen and  assessed briefly at the recommendations of Attending psychiatrist. Upon entry writer introduced self by name (intentionally left out department). Patient removed covers from head and turned to speak with this provider, he also grabbed his remote to turn down the volume. Patient tells me " I am having a good day. I  am doing better. That chicken messed me up. The chicken wasn't cooked all the way. I was sick bad and throwing up, but I am eating and I feel better now that I am eating. Patient further reports staying at the [facility] and has a house on [address] but does better when at the [facility]. He states when at the [facility] he can get his medications for his blood pressure. At which point I inquired about his psychiatric medications. Soon after patient began to derail, became disorganized and displaying clang association. He asked if I had a "white claw" and began to point at the tv. Writer turned around to use the computer in the room, in which patient replies " I haven't been able to get that thing to work since I been hereAir cabin crew then inquired about patients success with LAI, and he pulls out both of this arms " I already got two shots." Referring to the IVs in his forearms.    Patient was able to engage in a linear conversation with appropriate answers until he recognized this provider was with psychiatry. Upon leaving the room patient states " where are you going? Are you coming back?" Provider responded no in which he returned to nonsensical speech , clang words and rapping. After completely closing the door, patient unmuted the TV and talking stopped. Patient current symptoms presentation not consistent with acute psychosis or mania at this time. It appears some of his clinical symptoms (to include) intermittent episodes of psychosis, clang association, disorganized thought processes seems to demonstrate patterns of behavior consistent with intentionality and possible secondary gain(attention).   His mood was pleasant and euthymic with congruent affect.  At one point in time he did look in the corner and began to start talking, prior to returning back to conversation and laughing about long-acting medications.  Patient also may some very odd statements, such as asking provider to get white claw and Mr. Jillyn Hidden.   Recommend  continuing ongoing treatment, without stigmatizing patient.  Patient may benefit from ongoing antipsychotic use versus long-acting injectable to help for overall maintenance and reduction of schizophrenia symptoms.  Chart review further shows patient has a history of similar presentation to include bizarre behavior, hallucinations, delusional, responding to internal stimuli; will clear with/without use of low-dose antipsychotic (Seroquel 25 mg p.o. twice daily).  During this admission patient has not displayed any disruptive behaviors, and has been compliant with most medications.   Please see note from attending for final disposition.    ROS:  Unable to answer ROS questions on my assessment  Collateral information:  Could not reach son  Psychiatric History:  Information collected from pt, medical record Pt was seen by Dr. Jennelle Human for schizophrenia in 2016 Was dx with dementia in early 2020s Struggled with cocaine in 20teens, 2020s.  Unknown hx psych admissions  Family psych history: Unknown   Social History:   Tobacco use: yes per EMR Alcohol use: unable to assess Drug use: unable to assess   Family History:  The patient's family history is not on file.  Medical History: Past Medical History:  Diagnosis Date   Bipolar 1 disorder (HCC)    Hypertension  Surgical History: History reviewed. No pertinent surgical history.  Medications:   Current Facility-Administered Medications:    ondansetron (ZOFRAN) injection 4 mg, 4 mg, Intravenous, Q6H PRN, Champ Mungo, DO   risperiDONE (RISPERDAL) tablet 0.5 mg, 0.5 mg, Oral, BID, Jerrell Mangel A, 0.5 mg at 05/14/23 1352   rivaroxaban (XARELTO) tablet 10 mg, 10 mg, Oral, Daily, Champ Mungo, DO, 10 mg at 05/14/23 1043  Allergies: No Known Allergies     Objective  Vital signs:  Temp:  [98 F (36.7 C)-98.4 F (36.9 C)] 98 F (36.7 C) (06/24 0811) Pulse Rate:  [66-80] 66 (06/24 0811) Resp:  [15-19] 18 (06/24  0811) BP: (146-173)/(85-111) 146/85 (06/24 0811) SpO2:  [95 %-100 %] 99 % (06/24 0811) Weight:  [77.6 kg] 77.6 kg (06/23 1849)  Psychiatric Specialty Exam: Note: this reflects my interaction with pt, during which I believe he was  Presentation  General Appearance: Appropriate for Environment  Eye Contact:Fair  Speech:Clear and Coherent; Normal Rate (stacatto prosody)  Speech Volume:Normal  Handedness:No data recorded  Mood and Affect  Mood:-- (did not state)  Affect:Full Range (irritable)   Thought Process  Thought Processes:Disorganized; Irrevelant  Descriptions of Associations:Loose  Orientation:Full (Time, Place and Person)  Thought Content:-- (paranoia)  History of Schizophrenia/Schizoaffective disorder:No data recorded Duration of Psychotic Symptoms:No data recorded Hallucinations:Hallucinations: -- (Did endorse AH and using TV to drown out.)  Ideas of Reference:None  Suicidal Thoughts:Suicidal Thoughts: No  Homicidal Thoughts:Homicidal Thoughts: No   Sensorium  Memory:Immediate Fair; Recent Fair; Remote Good  Judgment:Poor  Insight:Poor   Executive Functions  Concentration:Fair  Attention Span:Fair  Recall:Fair  Fund of Knowledge:Fair  Language:Fair   Psychomotor Activity  Psychomotor Activity:Psychomotor Activity: Normal   Assets  Assets:Communication Skills; Desire for Improvement; Resilience   Sleep  Sleep:Sleep: -- (unable to assess)    Physical Exam: Physical Exam HENT:     Head: Normocephalic.  Eyes:     Conjunctiva/sclera: Conjunctivae normal.  Pulmonary:     Effort: Pulmonary effort is normal.  Neurological:     Mental Status: He is alert.     Blood pressure (!) 146/85, pulse 66, temperature 98 F (36.7 C), temperature source Oral, resp. rate 18, height 5\' 11"  (1.803 m), weight 77.6 kg, SpO2 99 %. Body mass index is 23.86 kg/m.

## 2023-05-15 ENCOUNTER — Other Ambulatory Visit (HOSPITAL_COMMUNITY): Payer: Self-pay

## 2023-05-15 DIAGNOSIS — G9340 Encephalopathy, unspecified: Secondary | ICD-10-CM | POA: Diagnosis not present

## 2023-05-15 LAB — LIPID PANEL
Cholesterol: 122 mg/dL (ref 0–200)
HDL: 40 mg/dL — ABNORMAL LOW (ref >40)
LDL Cholesterol: 73 mg/dL (ref 0–99)
Total CHOL/HDL Ratio: 3.1 RATIO
Triglycerides: 45 mg/dL (ref <150)
VLDL: 9 mg/dL (ref 0–40)

## 2023-05-15 LAB — CBC
HCT: 38.4 % — ABNORMAL LOW (ref 39.0–52.0)
Hemoglobin: 11.6 g/dL — ABNORMAL LOW (ref 13.0–17.0)
MCH: 28 pg (ref 26.0–34.0)
MCHC: 30.2 g/dL (ref 30.0–36.0)
MCV: 92.8 fL (ref 80.0–100.0)
Platelets: 338 10*3/uL (ref 150–400)
RBC: 4.14 MIL/uL — ABNORMAL LOW (ref 4.22–5.81)
RDW: 14.4 % (ref 11.5–15.5)
WBC: 5.7 10*3/uL (ref 4.0–10.5)
nRBC: 0 % (ref 0.0–0.2)

## 2023-05-15 MED ORDER — RISPERIDONE 0.5 MG PO TABS
0.5000 mg | ORAL_TABLET | Freq: Two times a day (BID) | ORAL | 0 refills | Status: AC
  Filled 2023-05-15: qty 60, 30d supply, fill #0

## 2023-05-15 NOTE — Discharge Instructions (Addendum)
You were hospitalized for recent nausea, vomiting and loose stools after eating food left out.  I am glad that the symptoms have resolved.  We did start you on a medication called risperidone which you will take 1 tablet 2 times a day.  Please make sure to follow-up with your primary care provider, Rema Fendt, NP.  Thank you for allowing [place] to be part of your care.   Please note these changes made to your medications:  *Please START taking:  -Risperidone 0.5 mg take one (1) tablet for two (2) times a day  -Please follow-up with your primary care doctor about starting medications for blood pressure.

## 2023-05-15 NOTE — Discharge Summary (Signed)
Name: Todd Neal MRN: [id number] DOB: May 20, 1946 77 y.o. PCP: Georgina Quint, MD  Date of Admission: 05/13/2023 12:35 AM Date of Discharge: 05/15/2023 Attending Physician: Dr.  Lafonda Mosses  Discharge Diagnosis: Principal Problem:   Encephalopathy Active Problems:   Psychosis (HCC)   Viral gastroenteritis    Discharge Medications: Allergies as of 05/15/2023   No Known Allergies      Medication List     STOP taking these medications    amLODipine 5 MG tablet Commonly known as: NORVASC   hydrochlorothiazide 25 MG tablet Commonly known as: HYDRODIURIL   QUEtiapine 25 MG tablet Commonly known as: SEROQUEL       TAKE these medications    risperiDONE 0.5 MG tablet Commonly known as: RISPERDAL Take 1 tablet (0.5 mg total) by mouth 2 (two) times daily.   VISINE OP Place 1 drop into both eyes daily as needed (red eyes).        Disposition and follow-up:   Mr.Kannen L Schopf was discharged from [facility] in Stable condition.  At the hospital follow up visit please address:  1.  Follow-up:  a. Hx of bipolar disorder and schizophrenia: Ensure patient's adherence to Risperdal, outpatient referral to psychiatry needed    b.  HTN: Reassess BP at PCP follow-up to decide if initiating antihypertensives (PTA was not on any medications)   c.  Imaging findings for multifocal pneumonia: He remains asymptomatic, afebrile, vital stable.  Reassess at follow-up, no antibiotics given at discharge.  2.  Labs / imaging needed at time of follow-up: none  3.  Pending labs/ test needing follow-up: none  4.  Medication Changes  -Risperidone 0.5 mg twice daily  Follow-up Appointments:  Follow-up Information     Sagardia, Eilleen Kempf, MD. Schedule an appointment as soon as possible for a visit.   Specialty: Internal Medicine Contact information: [address] [place] [address] [phone]         Rema Fendt, NP. Schedule an appointment  as soon as possible for a visit.   Specialty: Nurse Practitioner Contact information: [address] [place] [address] [phone]                 Hospital Course by problem list: Todd Neal is a 77 y.o. with a pertinent PMH of HTN, documented bipolar 1 disorder and schizophrenia who presents with nausea and vomiting and admitted for encephalopathy.  Concern for acute encephalopathy, ruled out Hx of bipolar disorder and schizophrenia Admitted due to concern for encephalopathy and disorganized thoughts. Overall his medical workup for AMS unremarkable. No acute findings on head CT. No electrolyte derangements and normal B12 and TSH. Vitals remains stable and he is afebrile. Likely altered mental status in setting of his untreated psychiatric conditions. Does not appear manic. Inpatient psychiatry consulted but does not believe he is in acute psychosis. At times he has disorganized thought and tangential speech which is pronounced when discussing with physicians. On day of discharge, able to have conversation with less tangential speech. He was started on Risperdal 0.5 mg BID which will be continued at discharge.  He will need referral to outpatient psychiatry at his PCP follow-up.   Imaging findings for multifocal pneumonia  Noted on multiple imaging modalities for patchy airspace disease. Patient is afebrile without leukocytosis, stable on room air, and denies any respiratory symptoms at present or over last several weeks. Lung exam unremarkable. Received empiric Unasyn 3 g once in ED. We elected  not continuing antibiotics at this time.    Viral gastroenteritis, resolved Timing of acute illness with GI symptoms coincides with consumption of food that was left out for prolonged period of time and at risk for food bourne illness. Symptoms have resolved and tolerating po intake.     HTN BP elevated during hospital course. Was not taking any antihypertensives prior to  admission. He was prescribed amlodipine and hydrochlorothiazide previously. Will have patient follow up with PCP regarding initiating antihypertensives.     Discharge Subjective: Patient was sitting comfortably in chair.  He was able to tell me his full name and he was at the hospital.  Unable to tell me the year.  He was able to have a full conversation and stated sitting in sunlight for warmth and will avoid bad chicken which gave him GI symptoms.  Denies any new concerns.  Discussed plan to return to [facility] which he is agreeable.  Discharge Exam:   BP (!) 158/72 (BP Location: Left Arm)   Pulse 69   Temp 97.9 F (36.6 C) (Oral)   Resp 18   Ht 5\' 11"  (1.803 m)   Wt 77.6 kg   SpO2 98%   BMI 23.86 kg/m  Constitutional: Patient sitting in chair comfortably, in no acute distress Cardiovascular: Regular rate Pulmonary: Normal work of breathing on room air MSK: Normal bulk and tone Neuro: Alert and oriented to self and place, no focal deficits Psych: Pleasant mood, more organized thought but at times tangential  Pertinent Labs, Studies, and Procedures:     Latest Ref Rng & Units 05/15/2023    4:24 AM 05/14/2023    3:50 AM 05/13/2023    9:08 AM  CBC  WBC 4.0 - 10.5 K/uL 5.7  7.1  9.7   Hemoglobin 13.0 - 17.0 g/dL 60.4  54.0  98.1   Hematocrit 39.0 - 52.0 % 38.4  36.5  44.4   Platelets 150 - 400 K/uL 338  337  376        Latest Ref Rng & Units 05/14/2023    3:50 AM 05/13/2023    9:08 AM 06/04/2022    2:57 PM  CMP  Glucose 70 - 99 mg/dL 95  191  90   BUN 8 - 23 mg/dL 12  16  19    Creatinine 0.61 - 1.24 mg/dL 4.78  2.95  6.21   Sodium 135 - 145 mmol/L 138  138  143   Potassium 3.5 - 5.1 mmol/L 3.8  3.9  4.3   Chloride 98 - 111 mmol/L 102  101  111   CO2 22 - 32 mmol/L 26  25  28    Calcium 8.9 - 10.3 mg/dL 8.8  9.5  9.6   Total Protein 6.5 - 8.1 g/dL  7.1    Total Bilirubin 0.3 - 1.2 mg/dL  0.4    Alkaline Phos 38 - 126 U/L  82    AST 15 - 41 U/L  19    ALT 0 - 44 U/L  13       CT HEAD WO CONTRAST ( )  Result Date: 05/13/2023 CLINICAL DATA:  Mental status change, unknown cause EXAM: CT HEAD WITHOUT CONTRAST TECHNIQUE: Contiguous axial images were obtained from the base of the skull through the vertex without intravenous contrast. RADIATION DOSE REDUCTION: This exam was performed according to the departmental dose-optimization program which includes automated exposure control, adjustment of the mA and/or kV according to patient size and/or use of iterative reconstruction technique. COMPARISON:  05/22/2022 FINDINGS: Brain: No evidence of acute infarction, hemorrhage, hydrocephalus, extra-axial collection or mass lesion/mass effect. Scattered low-density changes within the periventricular and subcortical white matter most compatible with chronic microvascular ischemic change. Mild diffuse cerebral volume loss. Vascular: No hyperdense vessel or unexpected calcification. Skull: Normal. Negative for fracture or focal lesion. Sinuses/Orbits: No acute finding. Other: None. IMPRESSION: No acute intracranial abnormality. Electronically Signed   By: Duanne Guess D.O.   On: 05/13/2023 15:25   DG Chest Port 1 View  Result Date: 05/13/2023 CLINICAL DATA:  Pneumonia. EXAM: PORTABLE CHEST 1 VIEW COMPARISON:  06/04/2022 FINDINGS: Patchy airspace disease is seen in the left lung base and infrahilar right lower lung. No pulmonary edema or substantial pleural effusion. Cardiopericardial silhouette is at upper limits of normal for size. No acute bony abnormality. Telemetry leads overlie the chest. IMPRESSION: Patchy airspace disease in the left lung base and infrahilar right lower lung. Imaging features compatible with multifocal pneumonia. Electronically Signed   By: Kennith Center M.D.   On: 05/13/2023 12:35   CT ABDOMEN PELVIS W CONTRAST  Result Date: 05/13/2023 CLINICAL DATA:  Abdominal pain EXAM: CT ABDOMEN AND PELVIS WITH CONTRAST TECHNIQUE: Multidetector CT imaging of the abdomen  and pelvis was performed using the standard protocol following bolus administration of intravenous contrast. RADIATION DOSE REDUCTION: This exam was performed according to the departmental dose-optimization program which includes automated exposure control, adjustment of the mA and/or kV according to patient size and/or use of iterative reconstruction technique. CONTRAST:  75mL OMNIPAQUE IOHEXOL 350 MG/ML SOLN COMPARISON:  None Available. FINDINGS: Lower chest: Heterogeneous consolidative airspace opacity throughout the left lung base and right middle lobe (series 5, image 21). Cardiomegaly. Hepatobiliary: No solid liver abnormality is seen. No gallstones, gallbladder wall thickening, or biliary dilatation. Pancreas: Unremarkable. No pancreatic ductal dilatation or surrounding inflammatory changes. Spleen: Normal in size without significant abnormality. Adrenals/Urinary Tract: Adrenal glands are unremarkable. Kidneys are normal, without renal calculi, solid lesion, or hydronephrosis. Bladder is unremarkable. Stomach/Bowel: Stomach is within normal limits. Appendix appears normal. The small bowel and colon are diffusely fluid-filled, however nonobstructed, with gas, fluid, and stool present to the rectum. Vascular/Lymphatic: Aortic atherosclerosis. No enlarged abdominal or pelvic lymph nodes. Reproductive: No mass or other significant abnormality. Other: Large, fluid-filled right inguinal hernia (series 6, image 26). Small volume ascites. Musculoskeletal: No acute or significant osseous findings. IMPRESSION: 1. The small bowel and colon are diffusely fluid-filled, however nonobstructed, with gas, fluid, and stool present to the rectum. Findings are consistent with nonspecific infectious or inflammatory enterocolitis. 2. Large, fluid-filled right inguinal hernia. 3. Small volume ascites. 4. Heterogeneous and consolidative airspace opacity throughout the left lung base and right middle lobe, consistent with infection  or aspiration. 5. Cardiomegaly. Aortic Atherosclerosis (ICD10-I70.0). Electronically Signed   By: Jearld Lesch M.D.   On: 05/13/2023 10:35     Discharge Instructions: Discharge Instructions     Call MD for:  difficulty breathing, headache or visual disturbances   Complete by: As directed    Call MD for:  extreme fatigue   Complete by: As directed    Call MD for:  hives   Complete by: As directed    Call MD for:  persistant dizziness or light-headedness   Complete by: As directed    Call MD for:  persistant nausea and vomiting   Complete by: As directed    Call MD for:  redness, tenderness, or signs of infection (pain, swelling, redness, odor or green/yellow discharge around incision site)  Complete by: As directed    Call MD for:  severe uncontrolled pain   Complete by: As directed    Call MD for:  temperature >100.4   Complete by: As directed    Diet - low sodium heart healthy   Complete by: As directed    Discharge instructions   Complete by: As directed    You were hospitalized for recent nausea, vomiting and loose stools after eating food left out.  I am glad that the symptoms have resolved.  We did start you on a medication called risperidone which you will take 1 tablet 2 times a day.  Please make sure to follow-up with your primary care provider, Rema Fendt, NP.  Thank you for allowing [place] to be part of your care.    Please note these changes made to your medications:  *Please START taking:  -Risperidone 0.5 mg take one (1) tablet for two (2) times a day   -Please follow-up with your primary care doctor about starting medications for blood pressure.   Increase activity slowly   Complete by: As directed        Signed: Rana Snare, DO 05/15/2023, 11:47 AM   Pager: [phone]

## 2023-05-15 NOTE — Progress Notes (Signed)
Pt to be d/c home to self care.  Reviewed AVS with pt.  Made him aware of his f/u appointments and changes to his medication list.  Made pt aware that he had prescriptions to be picked up from the Outpatient pharmacy  Answered any pending questions.  Pt has no further questions.  Assisted pt in gathering his belongings and getting dressed.  Removed PIV which was CDI and free from s/sx of infection.  Pt wheeled off unit accompanied by Jacques Earthly, NT to visitors entrance where his ride awaited to take him to [facility].  Pt d/c in stable condition.

## 2023-05-15 NOTE — Plan of Care (Signed)
Problem: Education: Goal: Knowledge of General Education information will improve Description: Including pain rating scale, medication(s)/side effects and non-pharmacologic comfort measures Outcome: Progressing Problem: Education: Pt does not have an understanding as to why he was admitted into the hospital.  He is altered and A/O to self and place.  Explained to pt that he was admitted into the hospital for n/v and abdominal pain.   Problem: Health Behavior/Discharge Planning: Goal: Ability to manage health-related needs will improve Outcome: Progressing Pt is a moderate assist with his ADLs.  He is x1 assist OOB to the [facility] or [facility].    Problem: Clinical Measurements: Goal: Will remain free from infection Outcome: Progressing S/Sx of infection monitored and assess q-shift.  Pt has remains afebrile thus far.     Problem: Clinical Measurements: Goal: Respiratory complications will improve Outcome: Progressing Respiratory status monitor and assessed q-shift.  Pt on room air with O2 saturations at 99% and respirations at 18 breaths per minute.  Instructed pt to cough and deep breathe to promote respiratory status.  Pt denies SOB and DOE.     Problem: Activity: Goal: Risk for activity intolerance will decrease Outcome: Progressing Pt is a moderate assist with his ADLs.  He is x1 assist OOB to the [facility] or [facility].    Problem: Nutrition: Goal: Adequate nutrition will be maintained Outcome: Not Progressing Pt is on a regular diet and has been able to tolerate it w/o c/o of n/v or abdominal pain.    Problem: Elimination: Goal: Will not experience complications related to urinary retention Outcome: Progressing Pt is intermittently incontinent of his bladder.  He has not endorsed c/o urinary retention, abdominal distention or pain.    Problem: Pain Managment: Goal: General experience of comfort will improve Outcome: Progressing Pt has denied pain thus far.    Problem: Safety: Goal: Ability to  remain free from injury will improve Outcome: Progressing Pt has remained free from falls thus far.  Instructed pt to utilize RN call light for assistance.  Hourly rounds performed.  Bed alarm implemented to keep pt safe from falls.  Settings activated to third most sensitive mode.  Bed in lowest position, locked with two upper side rails engaged.  Belongings and call light within reach.     Problem: Skin Integrity: Goal: Risk for impaired skin integrity will decrease Outcome: Progressing Skin integrity monitored and assessed q-shift. Instructed pt to turn/ reposition himself q2 hours to prevent further skin impairment. Tubes and drains assessed for device related pressure sores. Pt is intermittently incontinent of both his bowel and bladder.  He is checked q2 hours for incontinent episodes.  Perineal care given promptly after each episode.  Wounds care performed per WOC RN and Md's orders.

## 2023-05-15 NOTE — Care Plan (Signed)
Called and spoke to APS representative Ferrel Logan in regards to pt's d/c planning.  Informed her that pt would be d/c to [facility] and if she is ok with pt being d/c to [facility].  Mitzi Davenport stated "Ok, I am fine with his discharge."  Informed medical team that I have spoken to APS and that d/c is ok.

## 2023-05-15 NOTE — Progress Notes (Signed)
Spoke to On-call  MD Pager [phone] before midnight about patient blood pressure. Notes/ history reviewed and advised to keep monitoring while evaluating if patient is symptomatic. Patient endorses minor headache but no chest pain, palpitations or dizziness etc. BP remains high at this hour, but patient remains asymptomatic. Will continue monitoring. Safety maintained.

## 2023-05-16 ENCOUNTER — Telehealth: Payer: Self-pay

## 2023-05-16 NOTE — Transitions of Care (Post Inpatient/ED Visit) (Signed)
   05/16/2023  Name: Todd Neal MRN: [id number] DOB: [birth year]  Today's TOC FU Call Status: Today's TOC FU Call Status:: Unsuccessul Call (1st Attempt) Unsuccessful Call (1st Attempt) Date: 05/16/23  Attempted to reach the patient regarding the most recent Inpatient/ED visit.  Follow Up Plan: Additional outreach attempts will be made to reach the patient to complete the Transitions of Care (Post Inpatient/ED visit) call.   Signature   Woodfin Ganja LPN [facility] Nurse Health Advisor Direct Dial [phone]

## 2023-05-17 NOTE — Transitions of Care (Post Inpatient/ED Visit) (Signed)
   05/17/2023  Name: ZYREN SEVIGNY MRN: [id number] DOB: 19-Sep-1946  Today's TOC FU Call Status: Today's TOC FU Call Status:: Unsuccessful Call (2nd Attempt) Unsuccessful Call (1st Attempt) Date: 05/16/23 Unsuccessful Call (2nd Attempt) Date: 05/17/23  Attempted to reach the patient regarding the most recent Inpatient/ED visit.  Follow Up Plan: Additional outreach attempts will be made to reach the patient to complete the Transitions of Care (Post Inpatient/ED visit) call.   Signature   Woodfin Ganja LPN [facility] Nurse Health Advisor Direct Dial [phone]

## 2023-05-21 NOTE — Transitions of Care (Post Inpatient/ED Visit) (Signed)
   05/21/2023  Name: Todd Neal MRN: [id number] DOB: 01/02/1946  Today's TOC FU Call Status: Today's TOC FU Call Status:: Successful TOC FU Call Competed Unsuccessful Call (1st Attempt) Date: 05/16/23 Unsuccessful Call (2nd Attempt) Date: 05/17/23 Unsuccessful Call (3rd Attempt) Date: 05/21/23 [facility] FU Call Complete Date: 05/21/23  Attempted to reach the patient regarding the most recent Inpatient/ED visit.  Follow Up Plan: No further outreach attempts will be made at this time. We have been unable to contact the patient.  Signature   Woodfin Ganja LPN [facility] Nurse Health Advisor Direct Dial [phone]

## 2023-07-19 ENCOUNTER — Emergency Department (HOSPITAL_COMMUNITY): Payer: Medicare Other

## 2023-07-19 ENCOUNTER — Encounter (HOSPITAL_COMMUNITY): Payer: Self-pay

## 2023-07-19 ENCOUNTER — Emergency Department (HOSPITAL_COMMUNITY)
Admission: EM | Admit: 2023-07-19 | Discharge: 2023-07-19 | Disposition: A | Discharge status: Home / Self Care | Payer: Medicare Other | Attending: Emergency Medicine | Admitting: Emergency Medicine

## 2023-07-19 ENCOUNTER — Other Ambulatory Visit: Payer: Self-pay

## 2023-07-19 DIAGNOSIS — R400 Somnolence: Secondary | ICD-10-CM | POA: Insufficient documentation

## 2023-07-19 DIAGNOSIS — D72829 Elevated white blood cell count, unspecified: Secondary | ICD-10-CM | POA: Insufficient documentation

## 2023-07-19 DIAGNOSIS — I10 Essential (primary) hypertension: Secondary | ICD-10-CM | POA: Diagnosis not present

## 2023-07-19 DIAGNOSIS — Z5900 Homelessness unspecified: Secondary | ICD-10-CM | POA: Insufficient documentation

## 2023-07-19 DIAGNOSIS — R531 Weakness: Secondary | ICD-10-CM | POA: Diagnosis present

## 2023-07-19 LAB — CBC
HCT: 47.2 % (ref 39.0–52.0)
Hemoglobin: 14.2 g/dL (ref 13.0–17.0)
MCH: 29.5 pg (ref 26.0–34.0)
MCHC: 30.1 g/dL (ref 30.0–36.0)
MCV: 97.9 fL (ref 80.0–100.0)
Platelets: 320 10*3/uL (ref 150–400)
RBC: 4.82 MIL/uL (ref 4.22–5.81)
RDW: 13.9 % (ref 11.5–15.5)
WBC: 11.7 10*3/uL — ABNORMAL HIGH (ref 4.0–10.5)
nRBC: 0 % (ref 0.0–0.2)

## 2023-07-19 LAB — HEPATIC FUNCTION PANEL
ALT: 14 U/L (ref 0–44)
AST: 20 U/L (ref 15–41)
Albumin: 4.1 g/dL (ref 3.5–5.0)
Alkaline Phosphatase: 87 U/L (ref 38–126)
Bilirubin, Direct: 0.1 mg/dL (ref 0.0–0.2)
Indirect Bilirubin: 0.2 mg/dL — ABNORMAL LOW (ref 0.3–0.9)
Total Bilirubin: 0.3 mg/dL (ref 0.3–1.2)
Total Protein: 7.6 g/dL (ref 6.5–8.1)

## 2023-07-19 LAB — TROPONIN I (HIGH SENSITIVITY)
Troponin I (High Sensitivity): 17 ng/L (ref <18)
Troponin I (High Sensitivity): 16 ng/L (ref <18)

## 2023-07-19 LAB — I-STAT VENOUS BLOOD GAS, ED
Acid-Base Excess: 3 mmol/L — ABNORMAL HIGH (ref 0.0–2.0)
Bicarbonate: 28.9 mmol/L — ABNORMAL HIGH (ref 20.0–28.0)
Calcium, Ion: 1.15 mmol/L (ref 1.15–1.40)
HCT: 45 % (ref 39.0–52.0)
Hemoglobin: 15.3 g/dL (ref 13.0–17.0)
O2 Saturation: 98 %
Potassium: 4.4 mmol/L (ref 3.5–5.1)
Sodium: 140 mmol/L (ref 135–145)
TCO2: 30 mmol/L (ref 22–32)
pCO2, Ven: 49 mmHg (ref 44–60)
pH, Ven: 7.378 (ref 7.25–7.43)
pO2, Ven: 109 mmHg — ABNORMAL HIGH (ref 32–45)

## 2023-07-19 LAB — BASIC METABOLIC PANEL
Anion gap: 14 (ref 5–15)
BUN: 12 mg/dL (ref 8–23)
CO2: 22 mmol/L (ref 22–32)
Calcium: 9.8 mg/dL (ref 8.9–10.3)
Chloride: 104 mmol/L (ref 98–111)
Creatinine, Ser: 0.91 mg/dL (ref 0.61–1.24)
GFR, Estimated: >60 mL/min (ref >60)
Glucose, Bld: 112 mg/dL — ABNORMAL HIGH (ref 70–99)
Potassium: 4.6 mmol/L (ref 3.5–5.1)
Sodium: 140 mmol/L (ref 135–145)

## 2023-07-19 LAB — URINALYSIS, ROUTINE W REFLEX MICROSCOPIC
Bilirubin Urine: NEGATIVE
Glucose, UA: NEGATIVE mg/dL
Hgb urine dipstick: NEGATIVE
Ketones, ur: NEGATIVE mg/dL
Leukocytes,Ua: NEGATIVE
Nitrite: NEGATIVE
Protein, ur: NEGATIVE mg/dL
Specific Gravity, Urine: 1.015 (ref 1.005–1.030)
pH: 7 (ref 5.0–8.0)

## 2023-07-19 LAB — RAPID URINE DRUG SCREEN, HOSP PERFORMED
Amphetamines: NOT DETECTED
Barbiturates: NOT DETECTED
Benzodiazepines: NOT DETECTED
Cocaine: NOT DETECTED
Opiates: NOT DETECTED
Tetrahydrocannabinol: NOT DETECTED

## 2023-07-19 LAB — ETHANOL: Alcohol, Ethyl (B): <10 mg/dL (ref <10)

## 2023-07-19 LAB — CBG MONITORING, ED: Glucose-Capillary: 115 mg/dL — ABNORMAL HIGH (ref 70–99)

## 2023-07-19 LAB — LIPASE, BLOOD: Lipase: 28 U/L (ref 11–51)

## 2023-07-19 LAB — AMMONIA: Ammonia: 33 umol/L (ref 9–35)

## 2023-07-19 MED ORDER — HYDROCHLOROTHIAZIDE 25 MG PO TABS: 12.5000 mg | ORAL_TABLET | Freq: Every day | ORAL | 0 refills | Status: AC

## 2023-07-19 NOTE — ED Notes (Signed)
Pt ambulated. Pt slow to walk and has limb/wobble when walking. Rhetta Mura, MD made aware of and at bedside to see patient ambulate. Pt given soda and crackers. Pt ate and drink without difficulty. Rhetta Mura, MD bedside and aware patient able to eat/drink.

## 2023-07-19 NOTE — Final Consult Note (Addendum)
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, water quality- Schizophrenia living at [facility] (on and off his left leg since "high school"), HTN Previous admissions 6 23 through 6/25 viral gastroenteritis, admission 7-23 for subdural hematoma (nonsurgically managed)  Brought over from [facility] for vomiting- BP one 9000 degree oral temp CBG 113 Labs showed WBC 11 hemoglobin 14 platelet 320 BUNs/creatinine 12/0.9 indirect bili 0.2 Troponin 16 CT head performed showing bilateral temporomandibular arthrosis with no acute intracranial pathology CXR underinflation with basilar atelectasis EKG to my over read shows LVH with repolarization but troponin being negative and him having no chest pain leads me to believe that this can be followed as an outpatient with an echocardiogram  I was initially called by Dr. Theresia Lo secondary to the patient being somnolent to the point that she had to sternal rub him to keep him awake and he was not clearing mentally to awaken enough to take p.o. and/or interact  When I walk into the room patient was arousable to my voice Could give me a proper history --- thinks that he ate some bad chicken when he was at the [facility] and had some episodes of nausea vomiting Denies :-fevers chills nausea vomiting currently-denies unilateral weakness +: Does smoke-has occasional cough has occasional lower extremity swelling   was able to ambulate to the restroom with standby guard/contact-guard from nursing staff after placing socks on him--- additionally patient was able to tolerate Sprite and was able to sit up and tell me date time place year and was not disoriented at all  O/e BP (!) 167/88   Pulse 85   Temp 98.6 F (37 C) (Oral)   Resp 15   Ht 5\' 11"  (1.803 m)   Wt 77.6 kg   SpO2 96%   BMI 23.86 kg/m  Awake coherent black male looks younger than stated age well-built nourished Moderate dentition Neck soft supple CTAB no wheeze rales rhonchi Some stasis dermatitis changes to limbs with  trace lower extremity edema nonpitting Neurologically intact gait assessed slightly stooped but able to ambulate to the restroom as mentioned Psych euthymic-quiet affect-overall amenable/pleasant  Assessment/plan Somnolence Unclear what happened in the ED earlier but what ever that is seems to be resolved-I do not think we need to follow the ammonia level, I do not think that the patient needs any further in-hospital workup and I do think that he probably may have taken more of his Risperdal and or taken this too late in the night and was more sleepy I feel that he is stable from my perspective to go back to [facility]  Lower extremity stasis dermatitis Does not seem infected-would consider outpatient echocardiogram/initiation of antihypertensives such as HCTZ (I will place him on a 15-day supply of HCTZ 12.5) which will help control his blood pressure   Nausea vomiting etc. Tells me that he ate some "bad chicken"-seems to be keeping down fluids quite well now he can go on to a heart healthy diet at discharge  This patient does not require hospitalization-patient is stabilized meaningfully and durably in the ED and can return to [facility] I discussed this with current attending physician Dr. Maple Hudson of the ED  > 45 minutes

## 2023-07-19 NOTE — ED Notes (Addendum)
Pt currently has no documention of a legal guardian in the chart. Rhetta Mura, MD has deemed patient stable to be discharged. Pt's contacts on file called with no answer to either number. Pt alert/oriented x4 at this time. Pt given bus pass and escorted to the lobby.

## 2023-07-19 NOTE — ED Triage Notes (Signed)
Patient from [facility] EMS for weakness and vomiting from [facility]. 4 mg Zofran given for nausea/vomiting. HR from 50-120 with EMS, sinus. Hypertensive 190/90, hx of same. Patient states HR is irregular. 100.0 oral temp, CBG 113. 22-24 RR. 20 L AC.

## 2023-07-19 NOTE — Discharge Instructions (Addendum)
Please follow-up with your primary doctor soon as possible.  Turn immediately for fevers, chills, sudden onset headache, unilateral weakness, seizures, abdominal pain or any new or worsening symptoms that are concerning to you.

## 2023-07-19 NOTE — ED Notes (Signed)
Pt had a visitor that stated they were patients guardian. They explained that they wanted to be contacted before the patient's leave. Explained the patients guardian that there is no paperwork in the chart showing that patient has a guardian and that they need to speak to registration to have information logged into the chart. Explained to patient guardian to wait in the patients room and I would have registration come and speak to her to all the information. Pt visitor/guardian stood outside of the patient's room. Registration called and notified that patient has a guardian and the guardian wanted her information in the chart. Registration stated that they would come see the patient's visitor. After 5 minutes pt visitor/guardian asked another staff if they had been contacted. This RN told the staff member that they had been contacted 5 minutes ago. Registration showed up bedside but the visitor/guardian had left. The visitor/guardian did not let any staff know they were leaving and they did not leave any contact information.

## 2023-07-19 NOTE — ED Provider Notes (Signed)
[place] EMERGENCY DEPARTMENT AT [facility] Provider Note   CSN: [id number] Arrival date & time: 07/19/23  4098     History  Chief Complaint  Patient presents with   Weakness        Emesis    Todd Neal is a 77 y.o. male.  Patient is a 77 year old male with a past medical history of bipolar disorder and hypertension brought into the emergency department for weakness and vomiting.  The patient reports he is homeless and currently living at the [facility].  He states that he woke up this morning with nausea and vomiting.  He denies any chest or abdominal pain.  He denies any headaches.  He states he feels generally weak all over.  He denies any recent drug or alcohol use.  The history is provided by the patient and the EMS personnel.  Weakness Associated symptoms: vomiting   Emesis      Home Medications Prior to Admission medications   Medication Sig Start Date End Date Taking? Authorizing Provider  risperiDONE (RISPERDAL) 0.5 MG tablet Take 1 tablet (0.5 mg total) by mouth 2 (two) times daily. 05/15/23   Rana Snare, DO  Tetrahydrozoline HCl (VISINE OP) Place 1 drop into both eyes daily as needed (red eyes).    [provider]      Allergies    Patient has no known allergies.    Review of Systems   Review of Systems  Gastrointestinal:  Positive for vomiting.  Neurological:  Positive for weakness.    Physical Exam Updated Vital Signs BP (!) 167/88   Pulse 85   Temp 98.6 F (37 C) (Oral)   Resp 15   Ht 5\' 11"  (1.803 m)   Wt 77.6 kg   SpO2 96%   BMI 23.86 kg/m  Physical Exam Vitals and nursing note reviewed.  Constitutional:      General: He is not in acute distress.    Comments: Drowsy, arousable to noxious stimuli and easily falling back asleep on exam Disheveled appearing  HENT:     Head: Normocephalic and atraumatic.     Nose: Nose normal.     Mouth/Throat:     Mouth: Mucous membranes are moist.     Pharynx: Oropharynx is  clear.  Eyes:     Extraocular Movements: Extraocular movements intact.     Pupils: Pupils are equal, round, and reactive to light.  Cardiovascular:     Rate and Rhythm: Normal rate and regular rhythm.     Heart sounds: Normal heart sounds.  Pulmonary:     Effort: Pulmonary effort is normal.     Breath sounds: Normal breath sounds.  Abdominal:     General: Abdomen is flat.     Palpations: Abdomen is soft.     Tenderness: There is no abdominal tenderness.  Musculoskeletal:        General: Normal range of motion.     Cervical back: Normal range of motion.  Skin:    General: Skin is warm and dry.  Neurological:     General: No focal deficit present.     Mental Status: He is oriented to person, place, and time.     Sensory: No sensory deficit.     Motor: No weakness.  Psychiatric:        Mood and Affect: Mood normal.        Behavior: Behavior normal.     ED Results / Procedures / Treatments   Labs (all labs ordered  are listed, but only abnormal results are displayed) Labs Reviewed  BASIC METABOLIC PANEL - Abnormal; Notable for the following components:      Result Value   Glucose, Bld 112 (*)    All other components within normal limits  CBC - Abnormal; Notable for the following components:   WBC 11.7 (*)    All other components within normal limits  HEPATIC FUNCTION PANEL - Abnormal; Notable for the following components:   Indirect Bilirubin 0.2 (*)    All other components within normal limits  CBG MONITORING, ED - Abnormal; Notable for the following components:   Glucose-Capillary 115 (*)    All other components within normal limits  I-STAT VENOUS BLOOD GAS, ED - Abnormal; Notable for the following components:   pO2, Ven 109 (*)    Bicarbonate 28.9 (*)    Acid-Base Excess 3.0 (*)    All other components within normal limits  URINALYSIS, ROUTINE W REFLEX MICROSCOPIC  LIPASE, BLOOD  ETHANOL  RAPID URINE DRUG SCREEN, HOSP PERFORMED  AMMONIA  TROPONIN I (HIGH  SENSITIVITY)  TROPONIN I (HIGH SENSITIVITY)    EKG EKG Interpretation Date/Time:  Thursday July 19 2023 09:01:11 EDT Ventricular Rate:  58 PR Interval:  163 QRS Duration:  114 QT Interval:  421 QTC Calculation: 414 R Axis:   -5  Text Interpretation: Sinus rhythm Left atrial enlargement Left ventricular hypertrophy ST elevation, consider anterior injury Non-specific ST-t changes Confirmed by Elayne Snare ([id number]) on 07/19/2023 9:13:53 AM  Radiology CT Head Wo Contrast  Result Date: 07/19/2023 CLINICAL DATA:  Altered mental status, weakness and vomiting EXAM: CT HEAD WITHOUT CONTRAST TECHNIQUE: Contiguous axial images were obtained from the base of the skull through the vertex without intravenous contrast. RADIATION DOSE REDUCTION: This exam was performed according to the departmental dose-optimization program which includes automated exposure control, adjustment of the mA and/or kV according to patient size and/or use of iterative reconstruction technique. COMPARISON:  05/13/2023 FINDINGS: Brain: No evidence of acute infarction, hemorrhage, hydrocephalus, extra-axial collection or mass lesion/mass effect. Periventricular white matter hypodensity. Vascular: No hyperdense vessel or unexpected calcification. Skull: Normal. Negative for acute fracture or focal lesion. Nonacute nasal bone fractures. Sinuses/Orbits: No acute finding. Other: Severe bilateral temporomandibular arthrosis. IMPRESSION: 1. No acute intracranial pathology. Small-vessel white matter disease. 2.  Severe bilateral temporomandibular arthrosis. Electronically Signed   By: Jearld Lesch M.D.   On: 07/19/2023 15:01   DG Chest Port 1 View  Result Date: 07/19/2023 CLINICAL DATA:  Weakness EXAM: PORTABLE CHEST 1 VIEW COMPARISON:  X-ray 05/13/2023 FINDINGS: Underinflation. There is some linear opacity at the bases likely scar or atelectasis. No pneumothorax, effusion or edema. No consolidation. Stable cardiopericardial silhouette.  Overlapping cardiac leads. IMPRESSION: Underinflation with some basilar atelectasis Electronically Signed   By: Karen Kays M.D.   On: 07/19/2023 10:36    Procedures Procedures    Medications Ordered in ED Medications - No data to display  ED Course/ Medical Decision Making/ A&P Clinical Course as of 07/19/23 1652  Thu Jul 19, 2023  1234 Labs with mild leukocytosis otherwise no acute abnormality to explain symptoms. CTH and UA pending. [VK]  1449 Upon reassessment, patient is still very drowsy, essentially unchanged since arrival here. CTH read and UDS pending. [VK]  1505 No acute abnormality on CTH. [VK]  1651 Patient remains very drowsy without etiology for AMS. Will be admitted for further evaluation.  [VK]    Clinical Course User Index [VK] Rexford Maus, DO  Medical Decision Making This patient presents to the ED with chief complaint(s) of N/V, weakness with pertinent past medical history of HTN, bipolar disorder which further complicates the presenting complaint. The complaint involves an extensive differential diagnosis and also carries with it a high risk of complications and morbidity.    The differential diagnosis includes dehydration, electrolyte abnormality, ACS, arrhythmia, anemia, infection, sepsis, ICH, mass effect, no focal deficits making his CVA less likely, intoxication  Additional history obtained: Additional history obtained from EMS  Records reviewed previous admission documents - appeared to be admitted with similar symptoms last month  ED Course and Reassessment: On patient's arrival he was initially hypertensive otherwise hemodynamically stable in no acute distress.  He was drowsy appearing though arousable to noxious stimuli and oriented though quickly fell back asleep on exam.  He has not been actively vomiting here and reports that he received nausea medication and route.  EKG on arrival showed normal sinus rhythm  nonspecific T wave changes he will have labs including troponin, chest x-ray, head CT and urine performed and will be closely reassessed.  Independent labs interpretation:  The following labs were independently interpreted: within normal range  Independent visualization of imaging: - I independently visualized the following imaging with scope of interpretation limited to determining acute life threatening conditions related to emergency care: CTH, CXR, which revealed no acute disease  Consultation: - Consulted or discussed management/test interpretation w/ external professional: hospitalist  Consideration for admission or further workup: patient requires admission for further work up of AMS Social Determinants of health: homelessness    Amount and/or Complexity of Data Reviewed Labs: ordered. Radiology: ordered.  Risk Decision regarding hospitalization.          Final Clinical Impression(s) / ED Diagnoses Final diagnoses:  Somnolence    Rx / DC Orders ED Discharge Orders     None         Rexford Maus, DO 07/19/23 1652

## 2023-12-21 ENCOUNTER — Encounter (HOSPITAL_COMMUNITY): Payer: Self-pay | Admitting: Emergency Medicine

## 2023-12-21 ENCOUNTER — Emergency Department (HOSPITAL_COMMUNITY): Payer: Medicare Other

## 2023-12-21 ENCOUNTER — Emergency Department (HOSPITAL_COMMUNITY)
Admission: EM | Admit: 2023-12-21 | Discharge: 2023-12-22 | Disposition: A | Discharge status: Home / Self Care | Payer: Medicare Other | Attending: Emergency Medicine | Admitting: Emergency Medicine

## 2023-12-21 ENCOUNTER — Other Ambulatory Visit: Payer: Self-pay

## 2023-12-21 DIAGNOSIS — K4090 Unilateral inguinal hernia, without obstruction or gangrene, not specified as recurrent: Secondary | ICD-10-CM | POA: Insufficient documentation

## 2023-12-21 DIAGNOSIS — R4182 Altered mental status, unspecified: Secondary | ICD-10-CM | POA: Diagnosis not present

## 2023-12-21 DIAGNOSIS — N5089 Other specified disorders of the male genital organs: Secondary | ICD-10-CM | POA: Diagnosis present

## 2023-12-21 LAB — URINALYSIS, W/ REFLEX TO CULTURE (INFECTION SUSPECTED)
Bacteria, UA: NONE SEEN
Bilirubin Urine: NEGATIVE
Glucose, UA: NEGATIVE mg/dL
Hgb urine dipstick: NEGATIVE
Ketones, ur: NEGATIVE mg/dL
Leukocytes,Ua: NEGATIVE
Nitrite: NEGATIVE
Protein, ur: NEGATIVE mg/dL
Specific Gravity, Urine: 1.016 (ref 1.005–1.030)
pH: 5 (ref 5.0–8.0)

## 2023-12-21 LAB — LIPASE, BLOOD: Lipase: 35 U/L (ref 11–51)

## 2023-12-21 LAB — COMPREHENSIVE METABOLIC PANEL
ALT: 9 U/L (ref 0–44)
AST: 14 U/L — ABNORMAL LOW (ref 15–41)
Albumin: 4 g/dL (ref 3.5–5.0)
Alkaline Phosphatase: 75 U/L (ref 38–126)
Anion gap: 9 (ref 5–15)
BUN: 18 mg/dL (ref 8–23)
CO2: 29 mmol/L (ref 22–32)
Calcium: 9.8 mg/dL (ref 8.9–10.3)
Chloride: 101 mmol/L (ref 98–111)
Creatinine, Ser: 1.11 mg/dL (ref 0.61–1.24)
GFR, Estimated: >60 mL/min (ref >60)
Glucose, Bld: 112 mg/dL — ABNORMAL HIGH (ref 70–99)
Potassium: 3.4 mmol/L — ABNORMAL LOW (ref 3.5–5.1)
Sodium: 139 mmol/L (ref 135–145)
Total Bilirubin: 0.4 mg/dL (ref 0.0–1.2)
Total Protein: 6.9 g/dL (ref 6.5–8.1)

## 2023-12-21 LAB — CBC WITH DIFFERENTIAL/PLATELET
Abs Immature Granulocytes: 0.02 10*3/uL (ref 0.00–0.07)
Basophils Absolute: 0 10*3/uL (ref 0.0–0.1)
Basophils Relative: 1 %
Eosinophils Absolute: 0.3 10*3/uL (ref 0.0–0.5)
Eosinophils Relative: 4 %
HCT: 38.8 % — ABNORMAL LOW (ref 39.0–52.0)
Hemoglobin: 12.4 g/dL — ABNORMAL LOW (ref 13.0–17.0)
Immature Granulocytes: 0 %
Lymphocytes Relative: 40 %
Lymphs Abs: 2.6 10*3/uL (ref 0.7–4.0)
MCH: 30.2 pg (ref 26.0–34.0)
MCHC: 32 g/dL (ref 30.0–36.0)
MCV: 94.4 fL (ref 80.0–100.0)
Monocytes Absolute: 0.6 10*3/uL (ref 0.1–1.0)
Monocytes Relative: 10 %
Neutro Abs: 3 10*3/uL (ref 1.7–7.7)
Neutrophils Relative %: 45 %
Platelets: 344 10*3/uL (ref 150–400)
RBC: 4.11 MIL/uL — ABNORMAL LOW (ref 4.22–5.81)
RDW: 13.2 % (ref 11.5–15.5)
WBC: 6.6 10*3/uL (ref 4.0–10.5)
nRBC: 0 % (ref 0.0–0.2)

## 2023-12-21 MED ORDER — IOHEXOL 350 MG/ML SOLN
75.0000 mL | Freq: Once | INTRAVENOUS | Status: AC | PRN
  Administered 2023-12-21: 75 mL via INTRAVENOUS

## 2023-12-21 NOTE — ED Provider Triage Note (Signed)
Emergency Medicine Provider Triage Evaluation Note  Todd Neal , a 78 y.o. male  was evaluated in triage.  Here for groin swelling.  He reports it has been there for a long time, has been seen before. Thought it looked bigger today.  Today staff at [facility] asked if he wanted to be checked out.   Unclear if he has some confusion//baseline, has been here for altered mental status.    Here states it is 11th month. Knows he is [facility]. Initially tells me he lives at home alone as his wife left him but then states he lives at the [facility].  Prior notes say [facility]. Ems today reported ?group home.    Review of Systems  Positive: Right groin swelling Negative: Nausea, vomiting, abdominal pain, normal bm, is passing flatus  Physical Exam  BP 123/83   Pulse 83   Temp 98 F (36.7 C) (Oral)   Resp 18   Ht 5\' 11"  (1.803 m)   Wt 77.6 kg   SpO2 100%   BMI 23.86 kg/m  Gen:   Awake, no distress   Resp:  Normal effort  MSK:   Moves extremities without difficulty  Other:  Large right inguinal hernia. Nontender, unable to reduce   Medical Decision Making  Medically screening exam initiated at 7:39 PM.  Appropriate orders placed.  [name] was informed that the remainder of the evaluation will be completed by another provider, this initial triage assessment does not replace that evaluation, and the importance of remaining in the ED until their evaluation is complete.  Ordered ct to eval hernia, labs. Head CT given unclear if AMS, prior ICH.    Alvira Monday, MD 12/21/23 1949

## 2023-12-21 NOTE — ED Triage Notes (Signed)
Pt BIB EMS, per EMS pt lives in a group home. Pt states that he was at the [facility]. Per EMS group home staff noticed the swelling today while assisting with bathing, pt reported to EMS that the swelling started 2 days ago, states to this RN it has been "going on for a while". EMS reports intermittent confusion at baseline.

## 2023-12-22 NOTE — Discharge Instructions (Signed)
You need to follow-up with the general surgery clinic for evaluation of your hernia.  If you have pain, redness, or change in symptoms, you should return to the ER.

## 2023-12-22 NOTE — ED Provider Notes (Signed)
[facility] DEPT [facility] Emergency Department Provider Note MRN:  [id number]  Arrival date & time: 12/22/23     Chief Complaint   Groin Swelling (Testicle Swelling)   History of Present Illness   Todd Neal is a 78 y.o. year-old male presents to the ED with chief complaint of groin swelling.  Patient states that he has had the swelling for about a year.  Per triage RN and EMS, staff at group home noticed the swelling today while bathing the patient and sent him to the ER for evaluation.  He denies being in any pain.  Denies any treatments PTA.    History provided by patient.   Review of Systems  Pertinent positive and negative review of systems noted in HPI.    Physical Exam   Vitals:   12/22/23 0530 12/22/23 0545  BP: (!) 149/87 (!) 151/82  Pulse:    Resp:    Temp:    SpO2:      CONSTITUTIONAL:  non toxic-appearing, NAD NEURO:  Alert and oriented x 3, CN 3-12 grossly intact EYES:  eyes equal and reactive ENT/NECK:  Supple, no stridor  CARDIO:  normal rate, regular rhythm, appears well-perfused  PULM:  No respiratory distress, CTAB GI/GU:  non-distended, large swollen testicles, but no tenderness, erythema, no sign of infection MSK/SPINE:  No gross deformities, trace edema, moves all extremities  SKIN:  no rash, atraumatic   *Additional and/or pertinent findings included in MDM below  Diagnostic and Interventional Summary    EKG Interpretation Date/Time:    Ventricular Rate:    PR Interval:    QRS Duration:    QT Interval:    QTC Calculation:   R Axis:      Text Interpretation:         Labs Reviewed  CBC WITH DIFFERENTIAL/PLATELET - Abnormal; Notable for the following components:      Result Value   RBC 4.11 (*)    Hemoglobin 12.4 (*)    HCT 38.8 (*)    All other components within normal limits  COMPREHENSIVE METABOLIC PANEL - Abnormal; Notable for the following components:   Potassium 3.4 (*)    Glucose, Bld 112 (*)    AST 14 (*)     All other components within normal limits  LIPASE, BLOOD  URINALYSIS, W/ REFLEX TO CULTURE (INFECTION SUSPECTED)    CT Head Wo Contrast  Final Result    CT ABDOMEN PELVIS W CONTRAST  Final Result      Medications  iohexol (OMNIPAQUE) 350 MG/ML injection 75 mL (75 mLs Intravenous Contrast Given 12/21/23 2316)     Procedures  /  Critical Care Procedures  ED Course and Medical Decision Making  I have reviewed the triage vital signs, the nursing notes, and pertinent available records from the EMR.  Social Determinants Affecting Complexity of Care: Patient has decreased access to medical care.   ED Course:    Medical Decision Making Patient here with scrotal swelling.  He says that this has been going on for about a year.  Reportedly group home just noticed it today and sent him to the ER for evaluation.    His CT ordered in triage is notable for inguinal hernia that extends into the scrotum.  He doesn't have any redness or pain.  No vomiting, diarrhea, or signs/symptoms to suggest obstruction.  Remaining labs are reassuring.   I've recommend that the patient follow-up with his doctor and general surgery.  Consultants: No consultations were needed in caring for this patient.   Treatment and Plan: Emergency department workup does not suggest an emergent condition requiring admission or immediate intervention beyond  what has been performed at this time. The patient is safe for discharge and has  been instructed to return immediately for worsening symptoms, change in  symptoms or any other concerns  Patient seen by and discussed with attending physician, Dr. Bebe Shaggy, who agrees with plan for discharge.  Final Clinical Impressions(s) / ED Diagnoses     ICD-10-CM   1. Inguinal hernia without obstruction or gangrene, recurrence not specified, unspecified laterality  K40.90       ED Discharge Orders     None         Discharge Instructions Discussed  with and Provided to Patient:     Discharge Instructions      You need to follow-up with the general surgery clinic for evaluation of your hernia.  If you have pain, redness, or change in symptoms, you should return to the ER.       Roxy Horseman, PA-C 12/22/23 1610    Zadie Rhine, MD 12/22/23 [phone]

## 2023-12-22 NOTE — ED Notes (Signed)
Patient stated he no longer lives in group home, he stays at [facility]. Bus pass given with DC info.
